# Patient Record
Sex: Male | Born: 1980 | Race: White | Hispanic: No | Marital: Married | Smoking: Never smoker
Health system: Southern US, Community
[De-identification: ages and names within clinical notes are randomized; demographics above are authoritative.]

## PROBLEM LIST (undated history)

## (undated) ENCOUNTER — Emergency Department: Discharge status: Absent without leave | Payer: Self-pay

## (undated) DIAGNOSIS — J45909 Unspecified asthma, uncomplicated: Secondary | ICD-10-CM

## (undated) DIAGNOSIS — I10 Essential (primary) hypertension: Secondary | ICD-10-CM

## (undated) DIAGNOSIS — R519 Headache, unspecified: Secondary | ICD-10-CM

## (undated) DIAGNOSIS — R51 Headache: Secondary | ICD-10-CM

## (undated) HISTORY — PX: APPENDECTOMY: SHX54

## (undated) HISTORY — DX: Essential (primary) hypertension: I10

## (undated) HISTORY — DX: Headache, unspecified: R51.9

## (undated) HISTORY — DX: Headache: R51

## (undated) HISTORY — DX: Unspecified asthma, uncomplicated: J45.909

---

## 2010-01-10 ENCOUNTER — Observation Stay: Payer: Self-pay | Admitting: Surgery

## 2015-03-24 ENCOUNTER — Encounter: Payer: Self-pay | Admitting: Physician Assistant

## 2015-03-24 ENCOUNTER — Ambulatory Visit: Payer: Self-pay | Admitting: Physician Assistant

## 2015-03-24 VITALS — BP 120/80 | HR 80 | Temp 98.3°F

## 2015-03-24 DIAGNOSIS — J209 Acute bronchitis, unspecified: Secondary | ICD-10-CM

## 2015-03-24 DIAGNOSIS — J4520 Mild intermittent asthma, uncomplicated: Secondary | ICD-10-CM

## 2015-03-24 MED ORDER — FLUTICASONE-SALMETEROL 100-50 MCG/DOSE IN AEPB: 1.0000 | INHALATION_SPRAY | Freq: Two times a day (BID) | RESPIRATORY_TRACT | Status: DC

## 2015-03-24 MED ORDER — METHYLPREDNISOLONE 4 MG PO TBPK: ORAL_TABLET | ORAL | Status: DC

## 2015-03-24 NOTE — Progress Notes (Signed)
S: C/o cough and congestion with wheezing and chest pain, chest is sore from coughing,  Denies fever, chills;  cough is dry and hacking; states was treated for bronchitis in December with augmentin and ventolin, got better, isn't coughing up any mucus now, but feels like the bronchial tubes are still irritated, hx of asthma, nonsmoker; use to use advair but is out of medication  Remainder ros neg  O: vitals wnl, nad, tms clear, throat injected, neck supple no lymph, lungs c t a, cv rrr, neuro intact  A:  Acute asthmatic bronchitis   P:  rx medication:  Medrol dose pack, advair , continue ventolin; use otc meds,, return if not better in 3 -5 days, return earlier if worsening

## 2015-10-01 ENCOUNTER — Other Ambulatory Visit: Payer: Self-pay | Admitting: Physician Assistant

## 2015-10-01 DIAGNOSIS — J4520 Mild intermittent asthma, uncomplicated: Secondary | ICD-10-CM

## 2015-10-01 DIAGNOSIS — J209 Acute bronchitis, unspecified: Secondary | ICD-10-CM

## 2016-03-30 ENCOUNTER — Ambulatory Visit: Payer: Self-pay | Admitting: Physician Assistant

## 2016-03-30 VITALS — BP 142/90 | HR 102 | Temp 99.7°F

## 2016-03-30 DIAGNOSIS — B349 Viral infection, unspecified: Secondary | ICD-10-CM

## 2016-03-30 DIAGNOSIS — R509 Fever, unspecified: Secondary | ICD-10-CM

## 2016-03-30 LAB — POCT INFLUENZA A/B
INFLUENZA B, POC: NEGATIVE
Influenza A, POC: NEGATIVE

## 2016-03-30 NOTE — Progress Notes (Signed)
S: C/o runny nose , sore throat, body aches, cough and congestion for 1 days, + fever, chills, temp around 100 last night; denies cp/sob, v/d;    Using otc meds: robitussin  O: PE: vitals wnl, nad,  perrl eomi, normocephalic, tms dull, nasal mucosa red and swollen, throat injected, neck supple no lymph, lungs c t a, cv rrr, neuro intact, flu swab neg  A:  Acute flu like illness   P: drink fluids, continue regular meds , use otc meds of choice, return if not improving in 5 days, return earlier if worsening  tamiflu 75mg  bid x 5d, tamiflu for his wife, call pediatrician about kids

## 2016-04-02 NOTE — Progress Notes (Signed)
Patient called and expressed that he has a bad cough.  I informed Darl PikesSusan and she authorized me to call in [employer]ls 200mg  w/ no refills in to [facility] [place], [place].

## 2016-07-26 ENCOUNTER — Encounter: Payer: Self-pay | Admitting: Physician Assistant

## 2016-07-26 ENCOUNTER — Ambulatory Visit: Payer: Self-pay | Admitting: Physician Assistant

## 2016-07-26 VITALS — BP 130/89 | HR 78 | Temp 98.3°F

## 2016-07-26 DIAGNOSIS — R590 Localized enlarged lymph nodes: Secondary | ICD-10-CM

## 2016-07-26 NOTE — Progress Notes (Signed)
   Subjective:cervical lymph node    Patient ID: Carlos NottinghamMatthew Long, male    DOB: September 28, 1980, 36 y.o.   MRN: [id number]  HPI Patient states left cervical lymph node for 2 days. States not visible but palpable. Denies fever/chill, ear or throat pain. Denies tobacco use.   Review of Systems    Negative except for compliant.  Objective:   Physical Exam HEENT unremarkable except for let anterior/lateral palpable nodular lesion.       Assessment & Plan:adenopathy  Advised to monitor lesion and follow up in two weeks. Return sooner if condition worsen.

## 2016-08-13 ENCOUNTER — Ambulatory Visit: Payer: Self-pay | Admitting: Physician Assistant

## 2016-08-13 ENCOUNTER — Encounter: Payer: Self-pay | Admitting: Physician Assistant

## 2016-08-13 VITALS — BP 149/100 | HR 70 | Temp 98.5°F | Resp 16

## 2016-08-13 DIAGNOSIS — R591 Generalized enlarged lymph nodes: Secondary | ICD-10-CM

## 2016-08-13 MED ORDER — AMOXICILLIN 875 MG PO TABS: 875.0000 mg | ORAL_TABLET | Freq: Two times a day (BID) | ORAL | 0 refills | Status: DC

## 2016-08-13 NOTE — Progress Notes (Signed)
S: c/o swollen nodes, states he can still feel them along his neck, not as swollen but still concerned, no fever/chills/fatigue/cp/sob  O: vitals w elevated bp, neck supple + lymph on left side of neck along tonsillar area and then again at lower end of cervical chain, nodes are mobile, lungs c t a, cv rrr  A: lymphadenopathy,   P: cbc, amoxil, if not better in 1 week will refer to ENT for evaluation

## 2016-08-14 LAB — CBC WITH DIFFERENTIAL/PLATELET
BASOS: 1 %
Basophils Absolute: 0 10*3/uL (ref 0.0–0.2)
EOS (ABSOLUTE): 0.3 10*3/uL (ref 0.0–0.4)
Eos: 4 %
Hematocrit: 44.8 % (ref 37.5–51.0)
Hemoglobin: 15.2 g/dL (ref 13.0–17.7)
Immature Grans (Abs): 0 10*3/uL (ref 0.0–0.1)
Immature Granulocytes: 0 %
LYMPHS: 21 %
Lymphocytes Absolute: 1.6 10*3/uL (ref 0.7–3.1)
MCH: 28.9 pg (ref 26.6–33.0)
MCHC: 33.9 g/dL (ref 31.5–35.7)
MCV: 85 fL (ref 79–97)
Monocytes Absolute: 0.5 10*3/uL (ref 0.1–0.9)
Monocytes: 6 %
Neutrophils Absolute: 5.3 10*3/uL (ref 1.4–7.0)
Neutrophils: 68 %
PLATELETS: 219 10*3/uL (ref 150–379)
RBC: 5.26 x10E6/uL (ref 4.14–5.80)
RDW: 13.7 % (ref 12.3–15.4)
WBC: 7.7 10*3/uL (ref 3.4–10.8)

## 2016-08-15 ENCOUNTER — Ambulatory Visit: Payer: Self-pay | Admitting: *Deleted

## 2016-08-15 VITALS — BP 160/104

## 2016-08-15 DIAGNOSIS — Z0130 Encounter for examination of blood pressure without abnormal findings: Secondary | ICD-10-CM

## 2016-08-15 DIAGNOSIS — R030 Elevated blood-pressure reading, without diagnosis of hypertension: Secondary | ICD-10-CM

## 2016-08-15 NOTE — Progress Notes (Signed)
Carlos Long presented to Carlos Long onsite RN requesting BP check as follow up from OV in clinic with Carlos Long on 6/25.  BP still elevated. Reports HA as only sx. Denies Hx HTN prior to one week ago.  Taking abx as Rx'd.   CBC results reviewed with pt. WNL. Copy provided to pt.  Requested RN inform clinic of BP. Will route encounter to PA.   Instructed to call clinic if sx not resolved in one week after abx for referral to ENT.  Pt verbalizes understanding and agreement. No further questions.

## 2016-09-05 ENCOUNTER — Telehealth: Payer: Self-pay | Admitting: *Deleted

## 2016-09-05 NOTE — Telephone Encounter (Signed)
Pt seen in on-site [place] clinic and BP rechecked.  08/29/16: 160/106 09/05/16: 158/106  Remains hypertensive. Pt reports improving diet, decreasing Na+ intake, increasing exercise. Trying to reduce stress levels. Reports strong family Hx of HTN in father and many other close family members on father's side. Questions if sleep apnea could contribute to HTN as his wife believes he does have OSA. Requests sleep apnea study referral thru [facility] as he does not have a PCP.   Darl PikesSusan, please let me know what your thoughts are and if/when you would like to see Molli HazardMatthew in the clinic again.

## 2016-09-05 NOTE — Telephone Encounter (Signed)
Due to the way their insurance is, Carlos Long usually wants the testing done through a pcp not the clinic.  He needs a pcp, refer to either [facility] [address], [facility], or [facility].  I can recheck his bp and evaluate meds but eventually he needs to have a pcp take care of this.  We just do acute care for TOG not primary care.

## 2016-09-05 NOTE — Telephone Encounter (Signed)
Thanks, if he cannot see a pcp for several months I will help him with the htn but not the sleep apnea

## 2016-09-05 NOTE — Telephone Encounter (Signed)
Contact info for referred pcp options given to pt via email with instruction to establish primary care for further HTN care.

## 2016-10-11 ENCOUNTER — Ambulatory Visit (INDEPENDENT_AMBULATORY_CARE_PROVIDER_SITE_OTHER): Payer: BLUE CROSS/BLUE SHIELD | Admitting: Family

## 2016-10-11 ENCOUNTER — Encounter: Payer: Self-pay | Admitting: Family

## 2016-10-11 ENCOUNTER — Ambulatory Visit: Payer: Self-pay | Admitting: Family Medicine

## 2016-10-11 VITALS — BP 158/104 | HR 63 | Temp 98.3°F | Ht 71.0 in | Wt 210.4 lb

## 2016-10-11 DIAGNOSIS — R0683 Snoring: Secondary | ICD-10-CM | POA: Diagnosis not present

## 2016-10-11 DIAGNOSIS — I10 Essential (primary) hypertension: Secondary | ICD-10-CM | POA: Diagnosis not present

## 2016-10-11 DIAGNOSIS — G4733 Obstructive sleep apnea (adult) (pediatric): Secondary | ICD-10-CM | POA: Insufficient documentation

## 2016-10-11 DIAGNOSIS — I159 Secondary hypertension, unspecified: Secondary | ICD-10-CM

## 2016-10-11 MED ORDER — AMLODIPINE BESYLATE 2.5 MG PO TABS: 2.5000 mg | ORAL_TABLET | Freq: Every day | ORAL | 3 refills | Status: DC

## 2016-10-11 NOTE — Assessment & Plan Note (Signed)
Pending sleep study

## 2016-10-11 NOTE — Patient Instructions (Signed)
start amlodipine as discussed today. I will start on a very low dose 2.5 mg. The goal of blood pressure is less than 130/80  If blood pressures not at goal you may increase  amlodipine to 5 mg total per day as long as tolerating the medication okay  Please follow-up in one month  Referral cardiology due to family history  Referral for sleep study  Fasting labs next week   Managing Your Hypertension Hypertension is commonly called high blood pressure. This is when the force of your blood pressing against the walls of your arteries is too strong. Arteries are blood vessels that carry blood from your heart throughout your body. Hypertension forces the heart to work harder to pump blood, and may cause the arteries to become narrow or stiff. Having untreated or uncontrolled hypertension can cause heart attack, stroke, kidney disease, and other problems. What are blood pressure readings? A blood pressure reading consists of a higher number over a lower number. Ideally, your blood pressure should be below 120/80. The first ("top") number is called the systolic pressure. It is a measure of the pressure in your arteries as your heart beats. The second ("bottom") number is called the diastolic pressure. It is a measure of the pressure in your arteries as the heart relaxes. What does my blood pressure reading mean? Blood pressure is classified into four stages. Based on your blood pressure reading, your health care provider may use the following stages to determine what type of treatment you need, if any. Systolic pressure and diastolic pressure are measured in a unit called mm Hg. Normal  Systolic pressure: below 120.  Diastolic pressure: below 80. Elevated  Systolic pressure: 120-129.  Diastolic pressure: below 80. Hypertension stage 1  Systolic pressure: 130-139.  Diastolic pressure: 80-89. Hypertension stage 2  Systolic pressure: 140 or above.  Diastolic pressure: 90 or above. What  health risks are associated with hypertension? Managing your hypertension is an important responsibility. Uncontrolled hypertension can lead to:  A heart attack.  A stroke.  A weakened blood vessel (aneurysm).  Heart failure.  Kidney damage.  Eye damage.  Metabolic syndrome.  Memory and concentration problems.  What changes can I make to manage my hypertension? Hypertension can be managed by making lifestyle changes and possibly by taking medicines. Your health care provider will help you make a plan to bring your blood pressure within a normal range. Eating and drinking  Eat a diet that is high in fiber and potassium, and low in salt (sodium), added sugar, and fat. An example eating plan is called the DASH (Dietary Approaches to Stop Hypertension) diet. To eat this way: ? Eat plenty of fresh fruits and vegetables. Try to fill half of your plate at each meal with fruits and vegetables. ? Eat whole grains, such as whole wheat pasta, brown rice, or whole grain bread. Fill about one quarter of your plate with whole grains. ? Eat low-fat diary products. ? Avoid fatty cuts of meat, processed or cured meats, and poultry with skin. Fill about one quarter of your plate with lean proteins such as fish, chicken without skin, beans, eggs, and tofu. ? Avoid premade and processed foods. These tend to be higher in sodium, added sugar, and fat.  Reduce your daily sodium intake. Most people with hypertension should eat less than 1,500 mg of sodium a day.  Limit alcohol intake to no more than 1 drink a day for nonpregnant women and 2 drinks a day for men. One  drink equals 12 oz of beer, 5 oz of wine, or 1 oz of hard liquor. Lifestyle  Work with your health care provider to maintain a healthy body weight, or to lose weight. Ask what an ideal weight is for you.  Get at least 30 minutes of exercise that causes your heart to beat faster (aerobic exercise) most days of the week. Activities may  include walking, swimming, or biking.  Include exercise to strengthen your muscles (resistance exercise), such as weight lifting, as part of your weekly exercise routine. Try to do these types of exercises for 30 minutes at least 3 days a week.  Do not use any products that contain nicotine or tobacco, such as cigarettes and e-cigarettes. If you need help quitting, ask your health care provider.  Control any long-term (chronic) conditions you have, such as high cholesterol or diabetes. Monitoring  Monitor your blood pressure at home as told by your health care provider. Your personal target blood pressure may vary depending on your medical conditions, your age, and other factors.  Have your blood pressure checked regularly, as often as told by your health care provider. Working with your health care provider  Review all the medicines you take with your health care provider because there may be side effects or interactions.  Talk with your health care provider about your diet, exercise habits, and other lifestyle factors that may be contributing to hypertension.  Visit your health care provider regularly. Your health care provider can help you create and adjust your plan for managing hypertension. Will I need medicine to control my blood pressure? Your health care provider may prescribe medicine if lifestyle changes are not enough to get your blood pressure under control, and if:  Your systolic blood pressure is 130 or higher.  Your diastolic blood pressure is 80 or higher.  Take medicines only as told by your health care provider. Follow the directions carefully. Blood pressure medicines must be taken as prescribed. The medicine does not work as well when you skip doses. Skipping doses also puts you at risk for problems. Contact a health care provider if:  You think you are having a reaction to medicines you have taken.  You have repeated (recurrent) headaches.  You feel  dizzy.  You have swelling in your ankles.  You have trouble with your vision. Get help right away if:  You develop a severe headache or confusion.  You have unusual weakness or numbness, or you feel faint.  You have severe pain in your chest or abdomen.  You vomit repeatedly.  You have trouble breathing. Summary  Hypertension is when the force of blood pumping through your arteries is too strong. If this condition is not controlled, it may put you at risk for serious complications.  Your personal target blood pressure may vary depending on your medical conditions, your age, and other factors. For most people, a normal blood pressure is less than 120/80.  Hypertension is managed by lifestyle changes, medicines, or both. Lifestyle changes include weight loss, eating a healthy, low-sodium diet, exercising more, and limiting alcohol. This information is not intended to replace advice given to you by your health care provider. Make sure you discuss any questions you have with your health care provider. Document Released: 10/31/2011 Document Revised: 01/04/2016 Document Reviewed: 01/04/2016 Elsevier Interactive Patient Education  [employer].

## 2016-10-11 NOTE — Addendum Note (Signed)
Addended by: Elise Benne T on: 10/11/2016 10:31 AM   Modules accepted: Orders

## 2016-10-11 NOTE — Progress Notes (Signed)
Pre visit review using our clinic review tool, if applicable. No additional management support is needed unless otherwise documented below in the visit note. 

## 2016-10-11 NOTE — Progress Notes (Signed)
Subjective:    Patient ID: Carlos Long, male    DOB: 1981/01/20, 36 y.o.   MRN: [id number]  CC: Carlos Long is a 36 y.o. male who presents today to establish care.    HPI: Concern for elevated blood pressures. He brings a log from home which shows numbers from 160/106-178/100  Denies exertional chest pain or pressure, numbness or tingling radiating to left arm or jaw, palpitations, dizziness, frequent headaches, changes in vision, or shortness of breath.   Also concerned for snoring. Wife tells him he snores and stops breathing during his sleep  Strong family h/o CVD. Had stress test 13+ years ago, and thinks normal.      HISTORY:  Past Medical History:  Diagnosis Date  . Asthma   . Frequent headaches   . Hypertension    Past Surgical History:  Procedure Laterality Date  . APPENDECTOMY     Family History  Problem Relation Age of Onset  . Diabetes Mother   . Arthritis Father   . Cancer Father        prostate  . Hyperlipidemia Father   . Hypertension Father   . Stroke Father 45  . Heart disease Father        CABG x 3  . Alcohol abuse Paternal Uncle   . Cancer Paternal Uncle        prostate  . Heart disease Paternal Grandmother   . Hypertension Paternal Grandmother   . Stroke Paternal Grandmother   . Pancreatic cancer Paternal Grandmother   . Alcohol abuse Paternal Grandfather   . Heart disease Paternal Grandfather   . Hypertension Paternal Grandfather   . Stroke Paternal Grandfather     Allergies: Patient has no known allergies. Current Outpatient Prescriptions on File Prior to Visit  Medication Sig Dispense Refill  . ADVAIR DISKUS 100-50 MCG/DOSE AEPB INHALE 1 PUFF INTO THE LUNGS 2 (TWO) TIMES DAILY. 60 each 3  . albuterol (PROVENTIL) (5 MG/ML) 0.5% nebulizer solution Take 2.5 mg by nebulization every 6 (six) hours as needed for wheezing or shortness of breath.     No current facility-administered medications on file prior to visit.     Social  History  Substance Use Topics  . Smoking status: Never Smoker  . Smokeless tobacco: Never Used  . Alcohol use 0.0 oz/week    Review of Systems  Constitutional: Negative for chills and fever.  Eyes: Negative for visual disturbance.  Respiratory: Negative for cough.   Cardiovascular: Negative for chest pain and palpitations.  Gastrointestinal: Negative for nausea and vomiting.  Neurological: Negative for headaches.      Objective:    BP (!) 158/104   Pulse 63   Temp 98.3 F (36.8 C) (Oral)   Ht 5\' 11"  (1.803 m)   Wt 210 lb 6.4 oz (95.4 kg)   SpO2 97%   BMI 29.34 kg/m  BP Readings from Last 3 Encounters:  10/11/16 (!) 158/104  08/15/16 (!) 160/104  08/13/16 (!) 149/100   Wt Readings from Last 3 Encounters:  10/11/16 210 lb 6.4 oz (95.4 kg)    Physical Exam  Constitutional: He appears well-developed and well-nourished.  Cardiovascular: Regular rhythm and normal heart sounds.   Pulmonary/Chest: Effort normal and breath sounds normal. No respiratory distress. He has no wheezes. He has no rhonchi. He has no rales.  Neurological: He is alert.  Skin: Skin is warm and dry.  Psychiatric: He has a normal mood and affect. His speech is normal and behavior is normal.  Vitals reviewed.      Assessment & Plan:   Problem List Items Addressed This Visit      Cardiovascular and Mediastinum   Secondary hypertension - Primary    Elevated today. We'll start amlodipine . Pending labs including lipid panel. We jointly agreed based on strong family history cardiovascular disease, CVA that we will refer patient for cardiac workup including possible stress test. Will follow with me in one month       Relevant Medications   amLODipine (NORVASC) 2.5 MG tablet   Other Relevant Orders   Ambulatory referral to Cardiology   Comprehensive metabolic panel   Lipid panel   Hemoglobin A1c     Other   Snores    Pending sleep study.      Relevant Orders   Ambulatory referral to Sleep  Studies       I am having Mr. Fatima start on amLODipine. I am also having him maintain his albuterol and ADVAIR DISKUS.   Meds ordered this encounter  Medications  . amLODipine (NORVASC) 2.5 MG tablet    Sig: Take 1 tablet (2.5 mg total) by mouth daily.    Dispense:  90 tablet    Refill:  3    Order Specific Question:   Supervising Provider    Answer:   Sherlene Shams [[id number]]    Return precautions given.   Risks, benefits, and alternatives of the medications and treatment plan prescribed today were discussed, and patient expressed understanding.   Education regarding symptom management and diagnosis given to patient on AVS.  Continue to follow with Allegra Grana, FNP for routine health maintenance.   [name] and I agreed with plan.   Rennie Plowman, FNP

## 2016-10-11 NOTE — Assessment & Plan Note (Signed)
Elevated today. We'll start amlodipine . Pending labs including lipid panel. We jointly agreed based on strong family history cardiovascular disease, CVA that we will refer patient for cardiac workup including possible stress test. Will follow with me in one month

## 2016-10-19 ENCOUNTER — Other Ambulatory Visit (INDEPENDENT_AMBULATORY_CARE_PROVIDER_SITE_OTHER): Payer: BLUE CROSS/BLUE SHIELD

## 2016-10-19 ENCOUNTER — Telehealth: Payer: Self-pay | Admitting: *Deleted

## 2016-10-19 DIAGNOSIS — I159 Secondary hypertension, unspecified: Secondary | ICD-10-CM

## 2016-10-19 DIAGNOSIS — I10 Essential (primary) hypertension: Secondary | ICD-10-CM

## 2016-10-19 LAB — LIPID PANEL
CHOL/HDL RATIO: 5
CHOLESTEROL: 205 mg/dL — AB (ref 0–200)
HDL: 42.4 mg/dL (ref >39.00)
LDL Cholesterol: 143 mg/dL — ABNORMAL HIGH (ref 0–99)
NonHDL: 162.7
TRIGLYCERIDES: 97 mg/dL (ref 0.0–149.0)
VLDL: 19.4 mg/dL (ref 0.0–40.0)

## 2016-10-19 LAB — COMPREHENSIVE METABOLIC PANEL
ALBUMIN: 4.8 g/dL (ref 3.5–5.2)
ALT: 38 U/L (ref 0–53)
AST: 24 U/L (ref 0–37)
Alkaline Phosphatase: 68 U/L (ref 39–117)
BILIRUBIN TOTAL: 0.5 mg/dL (ref 0.2–1.2)
BUN: 17 mg/dL (ref 6–23)
CALCIUM: 10.2 mg/dL (ref 8.4–10.5)
CO2: 29 mEq/L (ref 19–32)
CREATININE: 0.99 mg/dL (ref 0.40–1.50)
Chloride: 102 mEq/L (ref 96–112)
GFR: 89.64 mL/min (ref >60.00)
Glucose, Bld: 85 mg/dL (ref 70–99)
Potassium: 4.2 mEq/L (ref 3.5–5.1)
Sodium: 137 mEq/L (ref 135–145)
Total Protein: 7.6 g/dL (ref 6.0–8.3)

## 2016-10-19 LAB — HEMOGLOBIN A1C: Hgb A1c MFr Bld: 5.4 % (ref 4.6–6.5)

## 2016-10-19 NOTE — Telephone Encounter (Signed)
Opened in Error.

## 2016-12-11 ENCOUNTER — Ambulatory Visit: Payer: Self-pay | Admitting: Cardiovascular Disease

## 2016-12-16 NOTE — Progress Notes (Signed)
Cardiology Office Note  Date:  12/17/2016   ID:  Carlos Long, DOB [birth year], MRN [id number]  PCP:  Allegra GranaArnett, Carlos G, FNP   Chief Complaint  Patient presents with  . other    Ref by Carlos PlowmanMargaret Arnett, FNP for secondary HTN. "doing well." Pt. has a strong family history of CAD.     HPI:  Carlos Long is a 36 year old gentleman with past medical history of HTN ?OSA/snoring Fm hx of CAD Who presents by referral from Carlos Long for consultation of his risk factors for coronary disease, strong family history  He reports that he is a non-smoker, no diabetes Does exercise  active, 2 children, Denies any symptoms concerning for angina on exertion No shortness of breath,  No PND orthopnea Dad with CABG, 6150s, CVA nonsmoker,  Patient started on amlodipine 2.5 mg daily Blood pressure was fine and he stopped checking his blood pressure Mildly elevated on today's visit  EKG personally reviewed by myself on todays visit Shows normal sinus rhythm with rate 60 bpm no significant ST or T wave changes  In terms of his family history Father with bypass in his 2750s, he was a non-smoker Mother dm II,   Dads brother, druugs and ETOH Dads other brother, smoker,  Dads mother pancreastic CA Grandfther: MI, CVA   PMH:   has a past medical history of Asthma; Frequent headaches; and Hypertension.  PSH:    Past Surgical History:  Procedure Laterality Date  . APPENDECTOMY      Current Outpatient Prescriptions  Medication Sig Dispense Refill  . ADVAIR DISKUS 100-50 MCG/DOSE AEPB INHALE 1 PUFF INTO THE LUNGS 2 (TWO) TIMES DAILY. 60 each 3  . albuterol (PROVENTIL) (5 MG/ML) 0.5% nebulizer solution Take 2.5 mg by nebulization every 6 (six) hours as needed for wheezing or shortness of breath.    Marland Kitchen. amLODipine (NORVASC) 2.5 MG tablet Take 1 tablet (2.5 mg total) by mouth daily. 90 tablet 3   No current facility-administered medications for this visit.      Allergies:   Patient has no  known allergies.   Social History:  The patient  reports that he has never smoked. He has never used smokeless tobacco. He reports that he drinks alcohol. He reports that he does not use drugs.   Family History:   family history includes Alcohol abuse in his paternal grandfather and paternal uncle; Arthritis in his father; Cancer in his father and paternal uncle; Diabetes in his mother; Heart disease in his father, paternal grandfather, and paternal grandmother; Hyperlipidemia in his father; Hypertension in his father, paternal grandfather, and paternal grandmother; Pancreatic cancer in his paternal grandmother; Stroke in his paternal grandfather and paternal grandmother; Stroke (age of onset: 2448) in his father.    Review of Systems: Review of Systems  Constitutional: Negative.   Respiratory: Negative.   Cardiovascular: Negative.   Gastrointestinal: Negative.   Musculoskeletal: Negative.   Neurological: Negative.   Psychiatric/Behavioral: Negative.   All other systems reviewed and are negative.    PHYSICAL EXAM: VS:  BP (!) 140/96 (BP Location: Right Arm, Patient Position: Sitting, Cuff Size: Normal)   Pulse 60   Ht 5\' 11"  (1.803 m)   Wt 197 lb 8 oz (89.6 kg)   BMI 27.55 kg/m  , BMI Body mass index is 27.55 kg/m. GEN: Well nourished, well developed, in no acute distress  HEENT: normal  Neck: no JVD, carotid bruits, or masses Cardiac: RRR; no murmurs, rubs, or gallops,no edema  Respiratory:  clear  to auscultation bilaterally, normal work of breathing GI: soft, nontender, nondistended, + BS MS: no deformity or atrophy  Skin: warm and dry, no rash Neuro:  Strength and sensation are intact Psych: euthymic mood, full affect    Recent Labs: 08/13/2016: Hemoglobin 15.2; Platelets 219 10/19/2016: ALT 38; BUN 17; Creatinine, Ser 0.99; Potassium 4.2; Sodium 137    Lipid Panel Lab Results  Component Value Date   CHOL 205 (H) 10/19/2016   HDL 42.40 10/19/2016   LDLCALC 143 (H)  10/19/2016   TRIG 97.0 10/19/2016      Wt Readings from Last 3 Encounters:  12/17/16 197 lb 8 oz (89.6 kg)  10/11/16 210 lb 6.4 oz (95.4 kg)       ASSESSMENT AND PLAN:  Essential hypertension - Plan: EKG 12-Lead Blood pressure mildly elevated on today's visit Possibly white coat syndrome Recommend he restart checking his blood pressure at home and call our office if diastolic pressure runs more than 90 Amlodipine could be increased up to 5 or 10 mg daily if needed  Family history of coronary artery disease Discussed his family history at length Father with severe coronary disease requiring bypass surgery, no risk factors per the patient We have discussed various risk stratification techniques such as CT coronary calcium scoring or carotid ultrasound Young age, with no symptoms, stress testing would likely be of little clinical benefit and would not pick up moderate disease  He did not order any testing at this time, will think about it and call our office if he would like any screening studies  Mixed hyperlipidemia LDL 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 EKG 12-Lead     Signed, Carlos Long, M.D., Ph.D. 12/17/2016  [facility] [place], [place] [phone]

## 2016-12-17 ENCOUNTER — Ambulatory Visit (INDEPENDENT_AMBULATORY_CARE_PROVIDER_SITE_OTHER): Payer: 59 | Admitting: Cardiovascular Disease

## 2016-12-17 ENCOUNTER — Encounter: Payer: Self-pay | Admitting: Cardiovascular Disease

## 2016-12-17 VITALS — BP 140/96 | HR 60 | Ht 71.0 in | Wt 197.5 lb

## 2016-12-17 DIAGNOSIS — Z8249 Family history of ischemic heart disease and other diseases of the circulatory system: Secondary | ICD-10-CM

## 2016-12-17 DIAGNOSIS — E782 Mixed hyperlipidemia: Secondary | ICD-10-CM | POA: Diagnosis not present

## 2016-12-17 DIAGNOSIS — I10 Essential (primary) hypertension: Secondary | ICD-10-CM | POA: Diagnosis not present

## 2016-12-17 NOTE — Patient Instructions (Addendum)
Medication Instructions:   No medication changes made  Monitor blood pressure Goal is 130s/<90  Call if you need amlodipine 5 mg or 10 daily  Labwork:  No new labs needed  Testing/Procedures:  No further testing at this time  Research CT coronary calcium score, [place], $150  Other screening test is the carotid ultrasound $50   Follow-Up: It was a pleasure seeing you in the office today. Please call us if you have new issues that need to be addressed before your next appt.  [phone]  Your physician wants you to follow-up in:  As needed  If you need a refill on your cardiac medications before your next appointment, please call your pharmacy.

## 2017-01-26 ENCOUNTER — Other Ambulatory Visit: Payer: Self-pay | Admitting: Family

## 2017-01-26 DIAGNOSIS — I159 Secondary hypertension, unspecified: Secondary | ICD-10-CM

## 2017-05-27 ENCOUNTER — Other Ambulatory Visit: Payer: Self-pay | Admitting: Family

## 2017-05-27 DIAGNOSIS — I159 Secondary hypertension, unspecified: Secondary | ICD-10-CM

## 2017-06-27 ENCOUNTER — Ambulatory Visit: Payer: Self-pay | Admitting: *Deleted

## 2017-06-27 NOTE — Telephone Encounter (Signed)
Patient is calling to report he is concerned about more frequent "fluttering" he is having in his chest.  No Chest fluttering protocol- chest pain questions used. Reason for Disposition . [1] Chest pain lasts > 5 minutes AND [2] occurred > 3 days ago (72 hours) AND [3] NO chest pain or cardiac symptoms now  Answer Assessment - Initial Assessment Questions 1. LOCATION: "Where does it hurt?"       Mostly on left side of chest- on breast 2. RADIATION: "Does the pain go anywhere else?" (e.g., into neck, jaw, arms, back)     Just in chest- some times on R as well- mostly on L 3. ONSET: "When did the chest pain begin?" (Minutes, hours or days)      Fluttering has been present past few months- more frequent and stronger in the past few month- 3 1/2 - does not take breath away 4. PATTERN "Does the pain come and go, or has it been constant since it started?"  "Does it get worse with exertion?"      Comes and goes, exertion does not seem to matter 5. DURATION: "How long does it last" (e.g., seconds, minutes, hours)     3-4 seconds- feels "discomfort" 6. SEVERITY: "How bad is the pain?"  (e.g., Scale 1-10; mild, moderate, or severe)    - MILD (1-3): doesn't interfere with normal activities     - MODERATE (4-7): interferes with normal activities or awakens from sleep    - SEVERE (8-10): excruciating pain, unable to do any normal activities       Fluttering is getting attention 7. CARDIAC RISK FACTORS: "Do you have any history of heart problems or risk factors for heart disease?" (e.g., prior heart attack, angina; high blood pressure, diabetes, being overweight, high cholesterol, smoking, or strong family history of heart disease)     Blood pressure medication, slightly elevated 8. PULMONARY RISK FACTORS: "Do you have any history of lung disease?"  (e.g., blood clots in lung, asthma, emphysema, birth control pills)     No- allergy induced asthma 9. CAUSE: "What do you think is causing the chest pain?"     Stress 10. OTHER SYMPTOMS: "Do you have any other symptoms?" (e.g., dizziness, nausea, vomiting, sweating, fever, difficulty breathing, cough)       Frequent headaches 11. PREGNANCY: "Is there any chance you are pregnant?" "When was your last menstrual period?"       n/a  Protocols used: CHEST PAIN-A-AH

## 2017-06-28 ENCOUNTER — Telehealth: Payer: Self-pay | Admitting: Family

## 2017-06-28 ENCOUNTER — Ambulatory Visit: Payer: Self-pay | Admitting: Family

## 2017-06-28 NOTE — Telephone Encounter (Signed)
Thank you Olegario Messier. As you said, if palpations recur , he need to be seen at [facility] or ED.I agree with your plan ,advice.

## 2017-06-28 NOTE — Telephone Encounter (Signed)
FYI patient is scheduled to see Claris Che today at 4:00
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
## 2017-06-28 NOTE — Telephone Encounter (Signed)
Spoke with patient advised him PCP was out of office due to Emergency, patient stated he understood. 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 Denies chest pain , just flutters ( no flutters in last 12 hours), at times while working enough to get his attention. Advised patient also to call and consult with cardiologist as to concerns, patient last saw cardiology 10/18.  Patient could not take sooner appointment 07/03/17) due to work schedule. Patient was advised to seek medical attention immediately if symptoms worsen reoccur, or has chest pain. Patient agreed.

## 2017-06-28 NOTE — Telephone Encounter (Signed)
PCP is having to leave office need to reschedule patient, needs triage of symptoms prior to scheduling due to symptoms wore or the same patient may need to call cardiology for sooner appointment or be seen at ER or UC.   [facility] triage nurse may triage but would need to discuss scheduling.

## 2017-06-30 NOTE — Telephone Encounter (Signed)
Noted. Advised to go UC and agree

## 2017-07-03 ENCOUNTER — Ambulatory Visit: Payer: Self-pay | Admitting: Family

## 2017-10-18 ENCOUNTER — Ambulatory Visit (INDEPENDENT_AMBULATORY_CARE_PROVIDER_SITE_OTHER): Payer: Managed Care, Other (non HMO) | Admitting: Family

## 2017-10-18 ENCOUNTER — Encounter: Payer: Self-pay | Admitting: Family

## 2017-10-18 ENCOUNTER — Other Ambulatory Visit: Payer: Self-pay | Admitting: Family

## 2017-10-18 VITALS — BP 130/110 | HR 67 | Temp 98.5°F | Ht 71.0 in | Wt 202.2 lb

## 2017-10-18 DIAGNOSIS — Z23 Encounter for immunization: Secondary | ICD-10-CM | POA: Diagnosis not present

## 2017-10-18 DIAGNOSIS — K219 Gastro-esophageal reflux disease without esophagitis: Secondary | ICD-10-CM | POA: Insufficient documentation

## 2017-10-18 DIAGNOSIS — J4520 Mild intermittent asthma, uncomplicated: Secondary | ICD-10-CM

## 2017-10-18 DIAGNOSIS — Z0000 Encounter for general adult medical examination without abnormal findings: Secondary | ICD-10-CM | POA: Diagnosis not present

## 2017-10-18 DIAGNOSIS — I159 Secondary hypertension, unspecified: Secondary | ICD-10-CM | POA: Diagnosis not present

## 2017-10-18 DIAGNOSIS — R5383 Other fatigue: Secondary | ICD-10-CM | POA: Diagnosis not present

## 2017-10-18 DIAGNOSIS — R0683 Snoring: Secondary | ICD-10-CM

## 2017-10-18 MED ORDER — AMLODIPINE BESYLATE 5 MG PO TABS: 5.0000 mg | ORAL_TABLET | Freq: Every day | ORAL | 3 refills | Status: DC

## 2017-10-18 MED ORDER — ALBUTEROL SULFATE (5 MG/ML) 0.5% IN NEBU: 2.5000 mg | INHALATION_SOLUTION | Freq: Four times a day (QID) | RESPIRATORY_TRACT | 2 refills | Status: DC | PRN

## 2017-10-18 MED ORDER — FLUTICASONE-SALMETEROL 100-50 MCG/DOSE IN AEPB: 1.0000 | INHALATION_SPRAY | Freq: Two times a day (BID) | RESPIRATORY_TRACT | 3 refills | Status: DC

## 2017-10-18 NOTE — Assessment & Plan Note (Addendum)
Stable.  Refilled inhalers as requested

## 2017-10-18 NOTE — Assessment & Plan Note (Signed)
Pending sleep study

## 2017-10-18 NOTE — Progress Notes (Signed)
Subjective:    Patient ID: Carlos Long, male    DOB: 1980-07-29, 37 y.o.   MRN: [id number]  CC: [name] is a 37 y.o. male who presents today for physical exam.    HPI: Trouble sleeping for 6 months. Fatigued. Would also like Testerone tested. On rotating schedule at work. Trouble falling and staying asleep. Wakes up 4x per night. Snores. Endorses frequent ha and not really restored for long periods of time.  Works as Emergency planning/management officer.   HTN at home 104/80 few months ago. hasnt checked recently. No longer having palpitations. No CP, SOB.   GERD- prilosec everyday with symptom controlled.  Never smoker. No hoarseness, trouble swallowing.   Asthma- no wheezing, sob. Would like refills of inhaler as pecan trees in [place] will trigger      Seen at [facility] clinic  For palpitations- started on amlodipine, metoprolol G[place]an 11/2017- reviewed note. No stress testing at his age  Colorectal  Cancer Screening: No early family history Prostate Cancer Screening: Father had prostate cancer. Has been discussed with patient; patient consented to screen early due to elevated risk.No trouble urinating, hesitancy, testicular swelling, pain. No concern for STDs.   Immunizations       Tetanus -due         HIV Screening- Candidate for , declines Labs: Screening labs today. Exercise: No regular exercise.  Alcohol use: Rare Smoking/tobacco use: Nonsmoker.  Regular dental exams: UTD Wears seat belt: Yes. Skin: no skin changes; declines referral to dermatology  HISTORY:  Past Medical History:  Diagnosis Date  . Asthma   . Frequent headaches   . Hypertension     Past Surgical History:  Procedure Laterality Date  . APPENDECTOMY     Family History  Problem Relation Age of Onset  . Diabetes Mother   . Arthritis Father   . Cancer Father        prostate  . Hyperlipidemia Father   . Hypertension Father   . Stroke Father 49  . Heart disease Father        CABG x 3  . Alcohol  abuse Paternal Uncle   . Cancer Paternal Uncle        prostate  . Heart disease Paternal Grandmother   . Hypertension Paternal Grandmother   . Stroke Paternal Grandmother   . Pancreatic cancer Paternal Grandmother   . Alcohol abuse Paternal Grandfather   . Heart disease Paternal Grandfather   . Hypertension Paternal Grandfather   . Stroke Paternal Grandfather   . Colon cancer Neg Hx       ALLERGIES: Patient has no known allergies.  Current Outpatient Medications on File Prior to Visit  Medication Sig Dispense Refill  . metoprolol succinate (TOPROL-XL) 50 MG 24 hr tablet     . omeprazole (PRILOSEC) 20 MG capsule Take by mouth.     No current facility-administered medications on file prior to visit.     Social History   Tobacco Use  . Smoking status: Never Smoker  . Smokeless tobacco: Never Used  Substance Use Topics  . Alcohol use: Yes    Alcohol/week: 0.0 standard drinks  . Drug use: No    Review of Systems  Constitutional: Positive for fever. Negative for chills.  HENT: Negative for congestion.   Respiratory: Negative for cough and shortness of breath.   Cardiovascular: Negative for chest pain, palpitations and leg swelling.  Gastrointestinal: Negative for abdominal pain, diarrhea, nausea and vomiting.  Genitourinary: Negative for difficulty urinating, scrotal swelling  and testicular pain.  Musculoskeletal: Negative for myalgias.  Skin: Negative for rash.  Neurological: Positive for headaches.  Hematological: Negative for adenopathy.  Psychiatric/Behavioral: Positive for sleep disturbance. Negative for confusion.      Objective:    BP (!) 130/110   Pulse 67   Temp 98.5 F (36.9 C) (Oral)   Ht 5\' 11"  (1.803 m)   Wt 202 lb 4 oz (91.7 kg)   SpO2 97%   BMI 28.21 kg/m   BP Readings from Last 3 Encounters:  10/18/17 (!) 130/110  12/17/16 (!) 140/96  10/11/16 (!) 158/104   Wt Readings from Last 3 Encounters:  10/18/17 202 lb 4 oz (91.7 kg)  12/17/16 197  lb 8 oz (89.6 kg)  10/11/16 210 lb 6.4 oz (95.4 kg)    Physical Exam  Constitutional: He appears well-developed and well-nourished.  Neck: No thyroid mass and no thyromegaly present.  Cardiovascular: Regular rhythm and normal heart sounds.  Pulmonary/Chest: Effort normal and breath sounds normal. No respiratory distress. He has no wheezes. He has no rhonchi. He has no rales.  Lymphadenopathy:       Head (right side): No submental, no submandibular, no tonsillar, no preauricular, no posterior auricular and no occipital adenopathy present.       Head (left side): No submental, no submandibular, no tonsillar, no preauricular, no posterior auricular and no occipital adenopathy present.    He has no cervical adenopathy.    He has no axillary adenopathy.  Neurological: He is alert.  Skin: Skin is warm and dry.  Psychiatric: He has a normal mood and affect. His speech is normal and behavior is normal.  Vitals reviewed.      Assessment & Plan:   Problem List Items Addressed This Visit      Cardiovascular and Mediastinum   HTN (hypertension)    SBP Elevated. Increase amlodipine. Patient will let me know if not better      Relevant Medications   metoprolol succinate (TOPROL-XL) 50 MG 24 hr tablet   amLODipine (NORVASC) 5 MG tablet     Respiratory   Mild intermittent asthma without complication    Stable.  Refilled inhalers as requested       Relevant Medications   Fluticasone-Salmeterol (ADVAIR DISKUS) 100-50 MCG/DOSE AEPB   albuterol (PROVENTIL) (5 MG/ML) 0.5% nebulizer solution     Digestive   GERD (gastroesophageal reflux disease)    Symptoms controlled on PPI.  No red flag or alarm symptoms.  Patient declines consult with GI for possible EGD at this time which I think is reasonable.  I advised him that at some point, he would need EGD which he understood.will follow      Relevant Medications   omeprazole (PRILOSEC) 20 MG capsule     Other   Snores    Pending sleep  study.      Routine physical examination - Primary    Tdap given today.  Patient politely declines testicular, prostate exam.  However we did order his PSA based on his father's history of prostate cancer.      Relevant Orders   CBC with Differential/Platelet   Comprehensive metabolic panel   Hemoglobin A1c   Lipid panel   TSH   PSA   VITAMIN D 25 Hydroxy (Vit-D Deficiency, Fractures)   Testosterone , Free and Total   Other fatigue    Suspect multifactorial discussed with patient today.  Lack of exercise, rotating shift schedule, possible sleep apnea.  Pending labs include anemia, testosterone,  thyroid.      Relevant Orders   Ambulatory referral to Sleep Studies   CBC with Differential/Platelet   TSH   VITAMIN D 25 Hydroxy (Vit-D Deficiency, Fractures)   Testosterone , Free and Total    Other Visit Diagnoses    Need for diphtheria-tetanus-pertussis (Tdap) vaccine       Relevant Orders   Tdap vaccine greater than or equal to 7yo IM (Completed)       I have discontinued Zayin Schecter's amLODipine. I have changed his ADVAIR DISKUS to Fluticasone-Salmeterol. I have also changed his albuterol. Additionally, I am having him start on amLODipine. Lastly, I am having him maintain his metoprolol succinate and omeprazole.   Meds ordered this encounter  Medications  . Fluticasone-Salmeterol (ADVAIR DISKUS) 100-50 MCG/DOSE AEPB    Sig: Inhale 1 puff into the lungs 2 (two) times daily.    Dispense:  60 each    Refill:  3    Order Specific Question:   Supervising Provider    Answer:   Duncan Dull L [[id number]]  . albuterol (PROVENTIL) (5 MG/ML) 0.5% nebulizer solution    Sig: Take 0.5 mLs (2.5 mg total) by nebulization every 6 (six) hours as needed for wheezing or shortness of breath.    Dispense:  20 mL    Refill:  2    Order Specific Question:   Supervising Provider    Answer:   Darrick Huntsman, TERESA L [[id number]]  . amLODipine (NORVASC) 5 MG tablet    Sig: Take 1 tablet (5 mg total) by  mouth daily.    Dispense:  90 tablet    Refill:  3    Order Specific Question:   Supervising Provider    Answer:   Sherlene Shams [[id number]]    Return precautions given.   Risks, benefits, and alternatives of the medications and treatment plan prescribed today were discussed, and patient expressed understanding.   Education regarding symptom management and diagnosis given to patient on AVS.   Continue to follow with Allegra Grana, FNP for routine health maintenance.   [name] and I agreed with plan.   Rennie Plowman, FNP

## 2017-10-18 NOTE — Assessment & Plan Note (Signed)
Suspect multifactorial discussed with patient today.  Lack of exercise, rotating shift schedule, possible sleep apnea.  Pending labs include anemia, testosterone, thyroid.

## 2017-10-18 NOTE — Assessment & Plan Note (Addendum)
Symptoms controlled on PPI.  No red flag or alarm symptoms.  Patient declines consult with GI for possible EGD at this time which I think is reasonable.  I advised him that at some point, he would need EGD which he understood.will follow

## 2017-10-18 NOTE — Patient Instructions (Addendum)
Increase amlodipine to 5mg   Monitor blood pressure,  Goal is less than 130/80; if persistently higher, please make sooner follow up appointment so we can recheck you blood pressure and manage medications  Today we discussed referrals, orders. Sleep study   I have placed these orders in the system for you.  Please be sure to give [place] a call if you have not heard from our office regarding scheduling a test or regarding referral in a timely manner.  It is very important that you let me know as soon as possible.     Essentials for good sleep:   #1 Exercise #2 Limit Caffeine ( no caffeine after lunch) #3 No smart phones, TV prior to bed -- BLUE light is VERY activating and send the brain an 'awake message.'  #4 May try over the counter Unisom for sleep aid    Health Maintenance, Male A healthy lifestyle and preventive care is important for your health and wellness. Ask your health care provider about what schedule of regular examinations is right for you. What should I know about weight and diet? Eat a Healthy Diet  Eat plenty of vegetables, fruits, whole grains, low-fat dairy products, and lean protein.  Do not eat a lot of foods high in solid fats, added sugars, or salt.  Maintain a Healthy Weight Regular exercise can help you achieve or maintain a healthy weight. You should:  Do at least 150 minutes of exercise each week. The exercise should increase your heart rate and make you sweat (moderate-intensity exercise).  Do strength-training exercises at least twice a week.  Watch Your Levels of Cholesterol and Blood Lipids  Have your blood tested for lipids and cholesterol every 5 years starting at 39 years of age. If you are at high risk for heart disease, you should start having your blood tested when you are 39 years old. You may need to have your cholesterol levels checked more often if: ? Your lipid or cholesterol levels are high. ? You are older than 39 years of age. ? You are  at high risk for heart disease.  What should I know about cancer screening? Many types of cancers can be detected early and may often be prevented. Lung Cancer  You should be screened every year for lung cancer if: ? You are a current smoker who has smoked for at least 30 years. ? You are a former smoker who has quit within the past 15 years.  Talk to your health care provider about your screening options, when you should start screening, and how often you should be screened.  Colorectal Cancer  Routine colorectal cancer screening usually begins at 39 years of age and should be repeated every 5-10 years until you are 39 years old. You may need to be screened more often if early forms of precancerous polyps or small growths are found. Your health care provider may recommend screening at an earlier age if you have risk factors for colon cancer.  Your health care provider may recommend using home test kits to check for hidden blood in the stool.  A small camera at the end of a tube can be used to examine your colon (sigmoidoscopy or colonoscopy). This checks for the earliest forms of colorectal cancer.  Prostate and Testicular Cancer  Depending on your age and overall health, your health care provider may do certain tests to screen for prostate and testicular cancer.  Talk to your health care provider about any symptoms or concerns  you have about testicular or prostate cancer.  Skin Cancer  Check your skin from head to toe regularly.  Tell your health care provider about any new moles or changes in moles, especially if: ? There is a change in a mole's size, shape, or color. ? You have a mole that is larger than a pencil eraser.  Always use sunscreen. Apply sunscreen liberally and repeat throughout the day.  Protect yourself by wearing long sleeves, pants, a wide-brimmed hat, and sunglasses when outside.  What should I know about heart disease, diabetes, and high blood  pressure?  If you are 5818-39 years of age, have your blood pressure checked every 3-5 years. If you are 39 years of age or older, have your blood pressure checked every year. You should have your blood pressure measured twice-once when you are at a hospital or clinic, and once when you are not at a hospital or clinic. Record the average of the two measurements. To check your blood pressure when you are not at a hospital or clinic, you can use: ? An automated blood pressure machine at a pharmacy. ? A home blood pressure monitor.  Talk to your health care provider about your target blood pressure.  If you are between 2045-39 years old, ask your health care provider if you should take aspirin to prevent heart disease.  Have regular diabetes screenings by checking your fasting blood sugar level. ? If you are at a normal weight and have a low risk for diabetes, have this test once every three years after the age of 39. ? If you are overweight and have a high risk for diabetes, consider being tested at a younger age or more often.  A one-time screening for abdominal aortic aneurysm (AAA) by ultrasound is recommended for men aged 65-75 years who are current or former smokers. What should I know about preventing infection? Hepatitis B If you have a higher risk for hepatitis B, you should be screened for this virus. Talk with your health care provider to find out if you are at risk for hepatitis B infection. Hepatitis C Blood testing is recommended for:  Everyone born from [birth year] through 1967.  Anyone with known risk factors for hepatitis C.  Sexually Transmitted Diseases (STDs)  You should be screened each year for STDs including gonorrhea and chlamydia if: ? You are sexually active and are younger than 39 years of age. ? You are older than 39 years of age and your health care provider tells you that you are at risk for this type of infection. ? Your sexual activity has changed since you were last  screened and you are at an increased risk for chlamydia or gonorrhea. Ask your health care provider if you are at risk.  Talk with your health care provider about whether you are at high risk of being infected with HIV. Your health care provider may recommend a prescription medicine to help prevent HIV infection.  What else can I do?  Schedule regular health, dental, and eye exams.  Stay current with your vaccines (immunizations).  Do not use any tobacco products, such as cigarettes, chewing tobacco, and e-cigarettes. If you need help quitting, ask your health care provider.  Limit alcohol intake to no more than 2 drinks per day. One drink equals 12 ounces of beer, 5 ounces of wine, or 1 ounces of hard liquor.  Do not use street drugs.  Do not share needles.  Ask your health care provider for help  if you need support or information about quitting drugs.  Tell your health care provider if you often feel depressed.  Tell your health care provider if you have ever been abused or do not feel safe at home. This information is not intended to replace advice given to you by your health care provider. Make sure you discuss any questions you have with your health care provider. Document Released: 08/04/2007 Document Revised: 10/05/2015 Document Reviewed: 11/09/2014 Elsevier Interactive Patient Education  [employer].

## 2017-10-18 NOTE — Assessment & Plan Note (Addendum)
SBP Elevated. Increase amlodipine. Patient will let me know if not better

## 2017-10-18 NOTE — Assessment & Plan Note (Signed)
Tdap given today.  Patient politely declines testicular, prostate exam.  However we did order his PSA based on his father's history of prostate cancer.

## 2017-10-22 LAB — CBC WITH DIFFERENTIAL/PLATELET
BASOS ABS: 79 {cells}/uL (ref 0–200)
Basophils Relative: 0.6 %
EOS ABS: 144 {cells}/uL (ref 15–500)
Eosinophils Relative: 1.1 %
HEMATOCRIT: 42.3 % (ref 38.5–50.0)
HEMOGLOBIN: 14.6 g/dL (ref 13.2–17.1)
Lymphs Abs: 1651 cells/uL (ref 850–3900)
MCH: 29.1 pg (ref 27.0–33.0)
MCHC: 34.5 g/dL (ref 32.0–36.0)
MCV: 84.3 fL (ref 80.0–100.0)
MONOS PCT: 6.2 %
MPV: 11.1 fL (ref 7.5–12.5)
NEUTROS ABS: 10415 {cells}/uL — AB (ref 1500–7800)
Neutrophils Relative %: 79.5 %
Platelets: 222 10*3/uL (ref 140–400)
RBC: 5.02 10*6/uL (ref 4.20–5.80)
RDW: 12.4 % (ref 11.0–15.0)
Total Lymphocyte: 12.6 %
WBC mixed population: 812 cells/uL (ref 200–950)
WBC: 13.1 10*3/uL — ABNORMAL HIGH (ref 3.8–10.8)

## 2017-10-22 LAB — LIPID PANEL
Cholesterol: 200 mg/dL — ABNORMAL HIGH (ref <200)
HDL: 44 mg/dL (ref >40)
LDL Cholesterol (Calc): 132 mg/dL (calc) — ABNORMAL HIGH
Non-HDL Cholesterol (Calc): 156 mg/dL (calc) — ABNORMAL HIGH (ref <130)
TRIGLYCERIDES: 128 mg/dL (ref <150)
Total CHOL/HDL Ratio: 4.5 (calc) (ref <5.0)

## 2017-10-22 LAB — HEMOGLOBIN A1C
EAG (MMOL/L): 6 (calc)
Hgb A1c MFr Bld: 5.4 % of total Hgb (ref <5.7)
MEAN PLASMA GLUCOSE: 108 (calc)

## 2017-10-22 LAB — PSA: PSA: 1.2 ng/mL (ref <=4.0)

## 2017-10-22 LAB — VITAMIN D 25 HYDROXY (VIT D DEFICIENCY, FRACTURES): Vit D, 25-Hydroxy: 32 ng/mL (ref 30–100)

## 2017-10-22 LAB — COMPREHENSIVE METABOLIC PANEL
AG RATIO: 1.8 (calc) (ref 1.0–2.5)
ALT: 36 U/L (ref 9–46)
AST: 22 U/L (ref 10–40)
Albumin: 4.7 g/dL (ref 3.6–5.1)
Alkaline phosphatase (APISO): 66 U/L (ref 40–115)
BUN: 15 mg/dL (ref 7–25)
CHLORIDE: 105 mmol/L (ref 98–110)
CO2: 25 mmol/L (ref 20–32)
Calcium: 9.7 mg/dL (ref 8.6–10.3)
Creat: 1.18 mg/dL (ref 0.60–1.35)
GLOBULIN: 2.6 g/dL (ref 1.9–3.7)
Glucose, Bld: 85 mg/dL (ref 65–99)
Potassium: 4.6 mmol/L (ref 3.5–5.3)
SODIUM: 140 mmol/L (ref 135–146)
TOTAL PROTEIN: 7.3 g/dL (ref 6.1–8.1)
Total Bilirubin: 0.5 mg/dL (ref 0.2–1.2)

## 2017-10-22 LAB — TESTOSTERONE, FREE & TOTAL

## 2017-10-22 LAB — TSH: TSH: 0.3 m[IU]/L — AB (ref 0.40–4.50)

## 2017-10-23 NOTE — Telephone Encounter (Signed)
Spoke with pharmacist and he stated disregard refill request

## 2017-10-30 ENCOUNTER — Other Ambulatory Visit: Payer: Self-pay | Admitting: Family

## 2017-10-30 DIAGNOSIS — R7989 Other specified abnormal findings of blood chemistry: Secondary | ICD-10-CM

## 2017-10-30 NOTE — Progress Notes (Signed)
close

## 2017-11-01 ENCOUNTER — Telehealth: Payer: Self-pay | Admitting: *Deleted

## 2017-11-01 NOTE — Telephone Encounter (Signed)
Copied from CRM [id number]. Topic: General - Other >> Nov 01, 2017  3:10 PM Marylen PontoMcneil, Ja-Kwan wrote: Reason for CRM: Pt request lab results from 10/18/17 visit. Cb# [phone]

## 2017-11-01 NOTE — Telephone Encounter (Signed)
They were resulted .Marland Kitchen. Can you see my result note?

## 2017-11-01 NOTE — Telephone Encounter (Signed)
Patient given results and scheduled.

## 2017-11-04 ENCOUNTER — Other Ambulatory Visit (INDEPENDENT_AMBULATORY_CARE_PROVIDER_SITE_OTHER): Payer: Managed Care, Other (non HMO)

## 2017-11-04 DIAGNOSIS — R7989 Other specified abnormal findings of blood chemistry: Secondary | ICD-10-CM | POA: Diagnosis not present

## 2017-11-04 LAB — CBC WITH DIFFERENTIAL/PLATELET
BASOS ABS: 0 10*3/uL (ref 0.0–0.1)
Basophils Relative: 0.5 % (ref 0.0–3.0)
EOS ABS: 0.1 10*3/uL (ref 0.0–0.7)
Eosinophils Relative: 1.2 % (ref 0.0–5.0)
HEMATOCRIT: 43.7 % (ref 39.0–52.0)
HEMOGLOBIN: 14.8 g/dL (ref 13.0–17.0)
LYMPHS PCT: 15 % (ref 12.0–46.0)
Lymphs Abs: 1.5 10*3/uL (ref 0.7–4.0)
MCHC: 33.9 g/dL (ref 30.0–36.0)
MCV: 86.2 fl (ref 78.0–100.0)
MONO ABS: 0.5 10*3/uL (ref 0.1–1.0)
Monocytes Relative: 5.5 % (ref 3.0–12.0)
Neutro Abs: 7.6 10*3/uL (ref 1.4–7.7)
Neutrophils Relative %: 77.8 % — ABNORMAL HIGH (ref 43.0–77.0)
PLATELETS: 232 10*3/uL (ref 150.0–400.0)
RBC: 5.07 Mil/uL (ref 4.22–5.81)
RDW: 12.8 % (ref 11.5–15.5)
WBC: 9.7 10*3/uL (ref 4.0–10.5)

## 2017-11-04 LAB — TSH: TSH: 0.23 u[IU]/mL — AB (ref 0.35–4.50)

## 2017-11-04 LAB — T4, FREE: FREE T4: 1 ng/dL (ref 0.60–1.60)

## 2017-11-04 LAB — T3, FREE: T3 FREE: 3.7 pg/mL (ref 2.3–4.2)

## 2017-11-05 ENCOUNTER — Encounter: Payer: Self-pay | Admitting: Family

## 2017-11-09 LAB — TESTOSTERONE, FREE & TOTAL
Free Testosterone: 74.9 pg/mL (ref 35.0–155.0)
TESTOSTERONE, TOTAL, LC-MS-MS: 384 ng/dL (ref 250–1100)

## 2017-11-11 ENCOUNTER — Other Ambulatory Visit: Payer: Self-pay | Admitting: Family

## 2017-11-11 DIAGNOSIS — I159 Secondary hypertension, unspecified: Secondary | ICD-10-CM

## 2017-11-11 DIAGNOSIS — E0590 Thyrotoxicosis, unspecified without thyrotoxic crisis or storm: Secondary | ICD-10-CM

## 2017-12-16 ENCOUNTER — Telehealth: Payer: Self-pay | Admitting: Family

## 2017-12-16 NOTE — Telephone Encounter (Signed)
Copied from CRM [id number]. Topic: Quick Communication - See Telephone Encounter >> Dec 16, 2017  4:38 PM Angela Nevin wrote: CRM for notification. See Telephone encounter for: 12/16/17.  Pt called inquiring if the results from his in-home sleep study have been reviewed stating that he received a cpap machine though the mail today. Pt would like a call back to review when they arrive. Please advise.

## 2017-12-23 ENCOUNTER — Encounter: Payer: Self-pay | Admitting: Family

## 2017-12-23 NOTE — Telephone Encounter (Signed)
Sent mychart

## 2017-12-26 NOTE — Telephone Encounter (Signed)
Left voicemail to call ok for [facility] to speak with patient . We still haven't received sleep study. Can you please have them fax back to office at [phone]. Thanks

## 2018-01-03 NOTE — Telephone Encounter (Signed)
Did you receive sleep study

## 2018-01-10 ENCOUNTER — Encounter: Payer: Self-pay | Admitting: Family

## 2018-01-10 NOTE — Telephone Encounter (Signed)
Spoke to patient he denies any vision changes ,problems. If he doesn't hear back from eye MD , he will go to urgent care.

## 2018-01-10 NOTE — Telephone Encounter (Signed)
I spoke with patient again he denies possible foreign body getting into eye, through work or noticing. He is still waiting to hear from eye doctor.

## 2018-01-10 NOTE — Telephone Encounter (Signed)
Called [facility] and they're faxing sleep study to our office

## 2018-01-14 NOTE — Telephone Encounter (Signed)
Did you get sleep study

## 2018-06-06 ENCOUNTER — Emergency Department: Payer: Managed Care, Other (non HMO)

## 2018-06-06 ENCOUNTER — Emergency Department
Admission: EM | Admit: 2018-06-06 | Discharge: 2018-06-06 | Disposition: A | Discharge status: Home / Self Care | Payer: Managed Care, Other (non HMO) | Attending: Emergency Medicine | Admitting: Emergency Medicine

## 2018-06-06 ENCOUNTER — Other Ambulatory Visit: Payer: Self-pay

## 2018-06-06 ENCOUNTER — Ambulatory Visit: Payer: Self-pay | Admitting: Family

## 2018-06-06 DIAGNOSIS — I10 Essential (primary) hypertension: Secondary | ICD-10-CM | POA: Diagnosis not present

## 2018-06-06 DIAGNOSIS — R079 Chest pain, unspecified: Secondary | ICD-10-CM

## 2018-06-06 DIAGNOSIS — R0789 Other chest pain: Secondary | ICD-10-CM | POA: Insufficient documentation

## 2018-06-06 DIAGNOSIS — J45909 Unspecified asthma, uncomplicated: Secondary | ICD-10-CM | POA: Diagnosis not present

## 2018-06-06 DIAGNOSIS — Z79899 Other long term (current) drug therapy: Secondary | ICD-10-CM | POA: Insufficient documentation

## 2018-06-06 DIAGNOSIS — R0602 Shortness of breath: Secondary | ICD-10-CM | POA: Diagnosis present

## 2018-06-06 LAB — COMPREHENSIVE METABOLIC PANEL
ALT: 43 U/L (ref 0–44)
AST: 31 U/L (ref 15–41)
Albumin: 4.6 g/dL (ref 3.5–5.0)
Alkaline Phosphatase: 81 U/L (ref 38–126)
Anion gap: 9 (ref 5–15)
BUN: 17 mg/dL (ref 6–20)
CO2: 26 mmol/L (ref 22–32)
Calcium: 9.2 mg/dL (ref 8.9–10.3)
Chloride: 102 mmol/L (ref 98–111)
Creatinine, Ser: 0.88 mg/dL (ref 0.61–1.24)
GFR calc Af Amer: >60 mL/min (ref >60)
GFR calc non Af Amer: >60 mL/min (ref >60)
Glucose, Bld: 125 mg/dL — ABNORMAL HIGH (ref 70–99)
Potassium: 3.8 mmol/L (ref 3.5–5.1)
Sodium: 137 mmol/L (ref 135–145)
Total Bilirubin: 0.4 mg/dL (ref 0.3–1.2)
Total Protein: 7.4 g/dL (ref 6.5–8.1)

## 2018-06-06 LAB — CBC WITH DIFFERENTIAL/PLATELET
Abs Immature Granulocytes: 0.05 10*3/uL (ref 0.00–0.07)
Basophils Absolute: 0.1 10*3/uL (ref 0.0–0.1)
Basophils Relative: 1 %
Eosinophils Absolute: 0.1 10*3/uL (ref 0.0–0.5)
Eosinophils Relative: 1 %
HCT: 42.4 % (ref 39.0–52.0)
Hemoglobin: 14.5 g/dL (ref 13.0–17.0)
Immature Granulocytes: 0 %
Lymphocytes Relative: 6 %
Lymphs Abs: 0.9 10*3/uL (ref 0.7–4.0)
MCH: 29.1 pg (ref 26.0–34.0)
MCHC: 34.2 g/dL (ref 30.0–36.0)
MCV: 85 fL (ref 80.0–100.0)
Monocytes Absolute: 0.9 10*3/uL (ref 0.1–1.0)
Monocytes Relative: 6 %
Neutro Abs: 13.4 10*3/uL — ABNORMAL HIGH (ref 1.7–7.7)
Neutrophils Relative %: 86 %
Platelets: 228 10*3/uL (ref 150–400)
RBC: 4.99 MIL/uL (ref 4.22–5.81)
RDW: 12.2 % (ref 11.5–15.5)
WBC: 15.4 10*3/uL — ABNORMAL HIGH (ref 4.0–10.5)
nRBC: 0 % (ref 0.0–0.2)

## 2018-06-06 LAB — FIBRIN DERIVATIVES D-DIMER (ARMC ONLY): Fibrin derivatives D-dimer (ARMC): 307.44 ng/mL (FEU) (ref 0.00–499.00)

## 2018-06-06 LAB — TROPONIN I
Troponin I: <0.03 ng/mL (ref <0.03)
Troponin I: <0.03 ng/mL (ref <0.03)

## 2018-06-06 LAB — BRAIN NATRIURETIC PEPTIDE: B Natriuretic Peptide: 15 pg/mL (ref 0.0–100.0)

## 2018-06-06 MED ORDER — KETOROLAC TROMETHAMINE 30 MG/ML IJ SOLN
30.0000 mg | Freq: Once | INTRAMUSCULAR | Status: AC
  Administered 2018-06-06: 30 mg via INTRAVENOUS
  Filled 2018-06-06: qty 1

## 2018-06-06 NOTE — Telephone Encounter (Addendum)
I wanted to get tested for COVID-19 is reason caller is calling in. 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   No one that I know of that is positive for the COVID-19 virus. Has been experiencing chest pain and shortness of breath for the last 2-3 hours without any other symptoms.  Unable to check temperature at home.    I have referred him to the ED.   His wife is going to take him now to [facility].  I have sent these notes to Rennie Plowman, FNP so she would be aware.  I called the ED at [facility] and spoke with [place].  I let her know what was going on with the pt, his name and that he was coming in by private vehicle.  Reason for Disposition . [1] MODERATE difficulty breathing (e.g., speaks in phrases, SOB even at rest, pulse 100-120) AND [2] NEW-onset or WORSE than normal  Answer Assessment - Initial Assessment Questions 1. RESPIRATORY STATUS: "Describe your breathing?" (e.g., wheezing, shortness of breath, unable to speak, severe coughing)      I'm am having chest pain when I take deep breaths.   I'm shivering.   No exposure to COVID-19 that I know of. 2. ONSET: "When did this breathing problem begin?"      2-3 hours ago.   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    3. PATTERN "Does the difficult breathing come and go, or has it been constant since it started?"      Constant. 4. SEVERITY: "How bad is your breathing?" (e.g., mild, moderate, severe)    - MILD: No SOB at rest, mild SOB with walking, speaks normally in sentences, can lay down, no retractions, pulse < 100.    - MODERATE: SOB at rest, SOB with minimal exertion and prefers to sit, cannot lie down flat, speaks in phrases, mild retractions, audible wheezing, pulse 100-120.    - SEVERE: Very SOB at rest, speaks in single words, struggling to breathe, sitting hunched forward, retractions, pulse > 120      If I lay down it's worse.   If I sit up it helps.   I'm  short of breath. 5. RECURRENT SYMPTOM: "Have you had difficulty breathing before?" If so, ask: "When was the last time?" and "What happened that time?"      No 6. CARDIAC HISTORY: "Do you have any history of heart disease?" (e.g., heart attack, angina, bypass surgery, angioplasty)      Hypertension controlled with medication 7. LUNG HISTORY: "Do you have any history of lung disease?"  (e.g., pulmonary embolus, asthma, emphysema)     Asthma from allergies in the winter time 8. CAUSE: "What do you think is causing the breathing problem?"      I don't know. 9. OTHER SYMPTOMS: "Do you have any other symptoms? (e.g., dizziness, runny nose, cough, chest pain, fever)     Chest pain is worse when I take a deep breath. 10. PREGNANCY: "Is there any chance you are pregnant?" "When was your last menstrual period?"       N/A 11. TRAVEL: "Have you traveled out of the country in the last month?" (e.g., travel history, exposures)       No travels.  Protocols used: BREATHING DIFFICULTY-A-AH

## 2018-06-06 NOTE — Telephone Encounter (Signed)
Patient is scheduled for ED follow-up for Monday 06/09/18.

## 2018-06-06 NOTE — ED Provider Notes (Signed)
[facility] Emergency Department Provider Note   ____________________________________________   First MD Initiated Contact with Patient 06/06/18 [phone]     (approximate)  I have reviewed the triage vital signs and the nursing notes.   HISTORY  Chief Complaint Shortness of Breath   HPI Carlos Long is a 38 y.o. 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  He was sitting in his quad room and developed sudden onset of chest pain tightness in the middle of his chest.  It is made worse by deep breathing.  As he was going home he developed increasing shortness of breath.  He has not had any coughing or fever.  He is never had anything like this before.         Past Medical History:  Diagnosis Date  . Asthma   . Frequent headaches   . Hypertension     Patient Active Problem List   Diagnosis Date Noted  . Routine physical examination 10/18/2017  . Other fatigue 10/18/2017  . Mild intermittent asthma without complication 10/18/2017  . GERD (gastroesophageal reflux disease) 10/18/2017  . Family history of coronary artery disease 12/17/2016  . Mixed hyperlipidemia 12/17/2016  . HTN (hypertension) 10/11/2016  . OSA (obstructive sleep apnea) 10/11/2016    Past Surgical History:  Procedure Laterality Date  . APPENDECTOMY      Prior to Admission medications   Medication Sig Start Date End Date Taking? Authorizing Provider  albuterol (PROVENTIL) (5 MG/ML) 0.5% nebulizer solution Take 0.5 mLs (2.5 mg total) by nebulization every 6 (six) hours as needed for wheezing or shortness of breath. 10/18/17   Allegra Grana, FNP  amLODipine (NORVASC) 5 MG tablet Take 1 tablet (5 mg total) by mouth daily. 10/18/17   Allegra Grana, FNP  Fluticasone-Salmeterol (ADVAIR DISKUS) 100-50 MCG/DOSE AEPB Inhale 1 puff into the lungs 2 (two) times daily. 10/18/17   Allegra Grana, FNP  metoprolol succinate (TOPROL-XL) 50 MG 24 hr tablet  10/11/17   [provider]  omeprazole (PRILOSEC) 20 MG capsule Take by mouth. 06/28/17 06/28/18  [provider]    Allergies Patient has no known allergies.  Family History  Problem Relation Age of Onset  . Diabetes Mother   . Arthritis Father   . Cancer Father        prostate  . Hyperlipidemia Father   . Hypertension Father   . Stroke Father 53  . Heart disease Father        CABG x 3  . Alcohol abuse Paternal Uncle   . Cancer Paternal Uncle        prostate  . Heart disease Paternal Grandmother   . Hypertension Paternal Grandmother   . Stroke Paternal Grandmother   . Pancreatic cancer Paternal Grandmother   . Alcohol abuse Paternal Grandfather   . Heart disease Paternal Grandfather   . Hypertension Paternal Grandfather   . Stroke Paternal Grandfather   . Colon cancer Neg Hx     Social History Social History   Tobacco Use  . Smoking status: Never Smoker  . Smokeless tobacco: Never Used  Substance Use Topics  . Alcohol use: Yes    Alcohol/week: 0.0 standard drinks  . Drug use: No    Review of Systems  Constitutional: No fever/chills Eyes: No visual changes. ENT: No sore throat. Cardiovascular: See HPI . Respiratory: See HPI Gastrointestinal: No abdominal pain.  No nausea, no vomiting.  No diarrhea.  No constipation. Genitourinary: Negative for dysuria. Musculoskeletal:  Negative for back pain. Skin: Negative for rash. Neurological: Negative for headaches, focal weakness  ____________________________________________   PHYSICAL EXAM:  VITAL SIGNS: ED Triage Vitals  Enc Vitals Group     BP 06/06/18 0834 (!) 145/97     Pulse Rate 06/06/18 0834 98     Resp 06/06/18 0834 (!) 24     Temp 06/06/18 0834 98.6 F (37 C)     Temp Source 06/06/18 0834 Oral     SpO2 06/06/18 0834 98 %     Weight 06/06/18 0836 197 lb (89.4 kg)     Height 06/06/18 0836 5\' 11"  (1.803 m)     Head Circumference --      Peak Flow --      Pain Score 06/06/18 0836 4     Pain Loc --      Pain Edu?  --      Excl. in GC? --     Constitutional: Alert and oriented. Well appearing and in no acute distress. Eyes: Conjunctivae are normal.  Head: Atraumatic. Nose: No congestion/rhinnorhea. Mouth/Throat: Mucous membranes are moist.  Oropharynx non-erythematous. Neck: No stridor.  Cardiovascular: Normal rate, regular rhythm. Grossly normal heart sounds.  Good peripheral circulation. Respiratory: Normal respiratory effort.  No retractions. Lungs CTAB. Gastrointestinal: Soft and nontender. No distention. No abdominal bruits. No CVA tenderness. Musculoskeletal: No lower extremity tenderness nor edema. Neurologic:  Normal speech and language. No gross focal neurologic deficits are appreciated.  Skin:  Skin is warm, dry and intact. No rash noted. Psychiatric: Mood and affect are normal. Speech and behavior are normal.  ____________________________________________   LABS (all labs ordered are listed, but only abnormal results are displayed)  Labs Reviewed  COMPREHENSIVE METABOLIC PANEL - Abnormal; Notable for the following components:      Result Value   Glucose, Bld 125 (*)    All other components within normal limits  CBC WITH DIFFERENTIAL/PLATELET - Abnormal; Notable for the following components:   WBC 15.4 (*)    Neutro Abs 13.4 (*)    All other components within normal limits  TROPONIN I  FIBRIN DERIVATIVES D-DIMER ([facility] ONLY)  BRAIN NATRIURETIC PEPTIDE  TROPONIN I   ____________________________________________  EKG  EKG read interpreted by me shows normal sinus rhythm rate of 86 normal axis no acute ST-T wave changes ____________________________________________  RADIOLOGY  ED MD interpretation:    Official radiology report(s): Dg Chest Port 1 View  Result Date: 06/06/2018 CLINICAL DATA:  Chest pain and shortness of breath EXAM: PORTABLE CHEST 1 VIEW COMPARISON:  None. FINDINGS: Lungs are clear. Heart size and pulmonary vascularity are normal. No adenopathy. No  pneumothorax. No bone lesions. IMPRESSION: No edema or consolidation. Electronically Signed   By: Bretta Bang III M.D.   On: 06/06/2018 09:16    ____________________________________________   PROCEDURES  Procedure(s) performed (including Critical Care):  Procedures   ____________________________________________   INITIAL IMPRESSION / ASSESSMENT AND PLAN / ED COURSE Patient's labs are good except for the white blood count which is up slightly.  Chest x-ray is clear he is not running a fever he is not coughing he has no symptoms of coronavirus.  Chest pain and came on suddenly but his EKG is good as are both of his troponins.  He has seen Dr. Mariah Milling in the past.  He does have a family history of heart disease.  I will have him follow-up with Dr. Mariah Milling the cardiologist call him on Monday.  We will have him return here if he  is worse at all.  I should add that his chest pain got better with a shot of Toradol.  I will let him continue to take some Motrin.  Have him return here if he gets worse at all.         Clinical Course as of Jun 05 1208  Fri Jun 06, 2018  0929 Chloride: 102 [PM]    Clinical Course User Index [PM] Arnaldo NatalMalinda, Anaya Bovee F, MD     ____________________________________________   FINAL CLINICAL IMPRESSION(S) / ED DIAGNOSES  Final diagnoses:  Atypical chest pain     ED Discharge Orders    None       Note:  This document was prepared using Dragon voice recognition software and may include unintentional dictation errors.    Arnaldo NatalMalinda, Elberta Lachapelle F, MD 06/06/18 [phone]

## 2018-06-06 NOTE — ED Notes (Signed)
Pt alert and oriented X4, active, cooperative, pt in NAD. RR even and unlabored, color WNL.  Pt informed to return if any life threatening symptoms occur.  Discharge and followup instructions reviewed. Ambulates safely. Discussed cardiology referral.

## 2018-06-06 NOTE — Telephone Encounter (Signed)
LMTCB asking him to make ED f/u ASAP.

## 2018-06-06 NOTE — ED Triage Notes (Signed)
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

## 2018-06-06 NOTE — Discharge Instructions (Addendum)
Please take Motrin 3 of the over-the-counter pills 3 times a day for 3 days.  This should help with the chest pain.  Please return here for increasing shortness of breath, coughing, fever or worse chest pain.  Please follow-up with Dr. Mariah Milling, the cardiologist.  He will double check on your chest pain.  Call his office Monday morning for follow-up he should be able to see you Monday or Tuesday.

## 2018-06-06 NOTE — Telephone Encounter (Signed)
He is in ED  Sarah, call him later and offer f/u doxy , presuming his is not admitted

## 2018-06-09 ENCOUNTER — Ambulatory Visit (INDEPENDENT_AMBULATORY_CARE_PROVIDER_SITE_OTHER): Payer: Managed Care, Other (non HMO) | Admitting: Family

## 2018-06-09 ENCOUNTER — Telehealth: Payer: Self-pay | Admitting: Cardiovascular Disease

## 2018-06-09 ENCOUNTER — Encounter: Payer: Self-pay | Admitting: Family

## 2018-06-09 DIAGNOSIS — R079 Chest pain, unspecified: Secondary | ICD-10-CM | POA: Diagnosis not present

## 2018-06-09 DIAGNOSIS — I159 Secondary hypertension, unspecified: Secondary | ICD-10-CM | POA: Diagnosis not present

## 2018-06-09 MED ORDER — AMLODIPINE BESYLATE 10 MG PO TABS: 10.0000 mg | ORAL_TABLET | Freq: Every day | ORAL | 1 refills | Status: DC

## 2018-06-09 NOTE — Assessment & Plan Note (Addendum)
Reviewed emergency room visit with patient today. Etiology remains nonspecific. ? Asthma, GERD, pleurisy.    Chest pain, sob is improving.  He denies any acute symptoms today.  I placed a referral to cardiology today and patient will let me know of any new or worsening symptoms. He will let me know if he hasnt heard from [place] in the next 1-2 days for appointment.

## 2018-06-09 NOTE — Progress Notes (Signed)
This visit type was conducted due to national recommendations for restrictions regarding the COVID-19 pandemic (e.g. social distancing).  This format is felt to be most appropriate for this patient at this time.  All issues noted in this document were discussed and addressed.  No physical exam was performed (except for noted visual exam findings with Video Visits). Virtual Visit via Video Note  I connected with@  on 06/09/18 at 10:30 AM EDT by a video enabled telemedicine application and verified that I am speaking with the correct person using two identifiers.  Location patient: home Location provider:work  Persons participating in the virtual visit: patient, provider  I discussed the limitations of evaluation and management by telemedicine and the availability of in person appointments. The patient expressed understanding and agreed to proceed.  Interactive audio and video telecommunications were attempted between this provider and patient, however failed, due to patient having technical difficulties or patient did not have access to video capability.  We continued and completed visit with audio only.    HPI:  Chest pain x 4 days, improving.  Had to sleep sitting up over past 2 days, last night able to lay supine. SOB has improved. On advair. Hasnt tried nebulizer.   Sitting at computer at work, and felt chest tightness which started 4 days ago. Had taken prilosec day prior at 6pm, approx 12 hours prior. Eaten 2-3 hours prior to episode. Took vest off which didn't help. Suddenly had weight on chest and couldn't breath. Had pain in over 'sternum' in chest and couldn't lay down or sit down to get comfortable. Pain was not worsened when pressed on pushed on chest.  He went to urgent care at that time, then ED.   Pain improved with toradol in ED.  No LE edema, cough, fever, epigastric burning.   Gollan - last seen 2018. No stress test.   HTN- compliant with medication. At home 125/90.    Asthma- compliant with medication.   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 Normal BNP.  Given toradol. Low risk D dimer Follow up with Dr Mariah MillingGollan.    ROS: See pertinent positives and negatives per HPI.  Past Medical History:  Diagnosis Date  . Asthma   . Frequent headaches   . Hypertension     Past Surgical History:  Procedure Laterality Date  . APPENDECTOMY      Family History  Problem Relation Age of Onset  . Diabetes Mother   . Arthritis Father   . Cancer Father        prostate  . Hyperlipidemia Father   . Hypertension Father   . Stroke Father [id number]  . Heart disease Father        CABG x 3  . Alcohol abuse Paternal Uncle   . Cancer Paternal Uncle        prostate  . Heart disease Paternal Grandmother   . Hypertension Paternal Grandmother   . Stroke Paternal Grandmother   . Pancreatic cancer Paternal Grandmother   . Alcohol abuse Paternal Grandfather   . Heart disease Paternal Grandfather   . Hypertension Paternal Grandfather   . Stroke Paternal Grandfather   . Colon cancer Neg Hx     SOCIAL HX:  Non smoker   Current Outpatient Medications:  .  amLODipine (NORVASC) 10 MG tablet, Take 1 tablet (10 mg total) by mouth  daily., Disp: 90 tablet, Rfl: 1 .  Fluticasone-Salmeterol (ADVAIR DISKUS) 100-50 MCG/DOSE AEPB, Inhale 1 puff into the lungs 2 (two) times daily., Disp: 60 each, Rfl: 3 .  omeprazole (PRILOSEC) 20 MG capsule, Take by mouth., Disp: , Rfl:  .  albuterol (PROVENTIL) (5 MG/ML) 0.5% nebulizer solution, Take 0.5 mLs (2.5 mg total) by nebulization every 6 (six) hours as needed for wheezing or shortness of breath. (Patient not taking: Reported on 06/09/2018), Disp: 20 mL, Rfl: 2   ASSESSMENT AND PLAN:  Discussed the following assessment and plan:  Secondary hypertension - Plan: Ambulatory referral to Cardiology, amLODipine  (NORVASC) 10 MG tablet  Chest pain, unspecified type  Problem List Items Addressed This Visit      Cardiovascular and Mediastinum   HTN (hypertension) - Primary    Elevated.  Advised patient to go ahead and increase his amlodipine.  He will send me blood pressure readings from  home      Relevant Medications   amLODipine (NORVASC) 10 MG tablet   Other Relevant Orders   Ambulatory referral to Cardiology     Other   Chest pain    Reviewed emergency room visit with patient today. Etiology remains nonspecific. ? Asthma, GERD, pleurisy.    Chest pain, sob is improving.  He denies any acute symptoms today.  I placed a referral to cardiology today and patient will let me know of any new or worsening symptoms. He will let me know if he hasnt heard from [place] in the next 1-2 days for appointment.            I discussed the assessment and treatment plan with the patient. The patient was provided an opportunity to ask questions and all were answered. The patient agreed with the plan and demonstrated an understanding of the instructions.   The patient was advised to call back or seek an in-person evaluation if the symptoms worsen or if the condition fails to improve as anticipated.   Rennie Plowman, FNP

## 2018-06-09 NOTE — Progress Notes (Signed)
Virtual Visit via Video Note   This visit type was conducted due to national recommendations for restrictions regarding the COVID-19 Pandemic (e.g. social distancing) in an effort to limit this patient's exposure and mitigate transmission in our community.  Due to his co-morbid illnesses, this patient is at least at moderate risk for complications without adequate follow up.  This format is felt to be most appropriate for this patient at this time.  All issues noted in this document were discussed and addressed.  A limited physical exam was performed with this format.  Please refer to the patient's chart for his consent to telehealth for [facility].   I connected with  Carlos Long on 06/09/18 by a video enabled telemedicine application and verified that I am speaking with the correct person using two identifiers. I discussed the limitations of evaluation and management by telemedicine. The patient expressed understanding and agreed to proceed.   Evaluation Performed:  Follow-up visit  Date:  06/09/2018   ID:  Carlos Long, DOB 09-19-1980, MRN [id number]  Patient Location:  [address] [place] [address]   Provider location:   [facility], [place] office  PCP:  Carlos Grana, FNP  Cardiologist:  Carlos Long   Chief Complaint:  Chest pain    History of Present Illness:    Carlos Long is a 38 y.o. male who presents via audio/video conferencing for a telehealth visit today.   The patient does not symptoms concerning for COVID-19 infection (fever, chills, cough, or new SHORTNESS OF BREATH).   Patient has a past medical history of HTN ?OSA/snoring Fm hx of CAD Who presents for f/u of his risk factors for coronary disease, strong family history  Friday , working night shift Hurt to breath Recently seen in the emergency room June 06, 2018 for atypical chest pain,Shortness of breath Hospital records reviewed with the patient in detail  sitting , developed sudden onset of chest pain tightness in the middle of his chest.  It is made worse by deep breathing.  As he was going home he developed increasing shortness of breath.  He has not had any coughing or fever.  He is never had anything like this before. White count was 15 Took some Motrin Cardiac enzymes EKG normal  Lasted 2 days, took motrin, felt better Little bit with a deep breath Symptoms were worse laying down, better sitting up  Lab work reviewed with him in detail Total chol 200, LDL 132 Does not want a cholesterol medication  Has 2 children, active at baseline Works in [employer] examiner  No shortness of breath,  No PND orthopnea  Blood pressure running high now up to amlodipine 10 mg daily Has not checked her blood pressure since increasing amlodipine up to 10  Strong family history discussed with him again in detail Dad with CABG, 76s, CVA nonsmoker, Mother dm II,   Dads brother, druugs and ETOH Dads other brother, smoker,  Dads mother pancreastic CA Grandfther: MI, CVA    Prior CV studies:   The following studies were reviewed today:    Past Medical History:  Diagnosis Date  . Asthma   . Frequent headaches   . Hypertension    Past Surgical History:  Procedure Laterality Date  . APPENDECTOMY       No outpatient medications have been marked as taking for the 06/10/18 encounter (Appointment) with Carlos Iba, MD.     Allergies:   Patient has no  known allergies.   Social History   Tobacco Use  . Smoking status: Never Smoker  . Smokeless tobacco: Never Used  Substance Use Topics  . Alcohol use: Yes    Alcohol/week: 0.0 standard drinks  . Drug use: No     Current Outpatient Medications on File Prior to Visit  Medication Sig Dispense Refill  . albuterol (PROVENTIL) (5 MG/ML) 0.5% nebulizer solution Take 0.5 mLs (2.5 mg total) by nebulization every 6 (six) hours as needed for wheezing or shortness of breath. (Patient not  taking: Reported on 06/09/2018) 20 mL 2  . amLODipine (NORVASC) 10 MG tablet Take 1 tablet (10 mg total) by mouth daily. 90 tablet 1  . Fluticasone-Salmeterol (ADVAIR DISKUS) 100-50 MCG/DOSE AEPB Inhale 1 puff into the lungs 2 (two) times daily. 60 each 3  . omeprazole (PRILOSEC) 20 MG capsule Take by mouth.     No current facility-administered medications on file prior to visit.      Family Hx: The patient's family history includes Alcohol abuse in his paternal grandfather and paternal uncle; Arthritis in his father; Cancer in his father and paternal uncle; Diabetes in his mother; Heart disease in his father, paternal grandfather, and paternal grandmother; Hyperlipidemia in his father; Hypertension in his father, paternal grandfather, and paternal grandmother; Pancreatic cancer in his paternal grandmother; Stroke in his paternal grandfather and paternal grandmother; Stroke (age of onset: 33) in his father. There is no history of Colon cancer.  ROS:   Please see the history of present illness.    Review of Systems  Constitutional: Negative.   Respiratory: Negative.   Cardiovascular: Positive for chest pain.  Gastrointestinal: Negative.   Musculoskeletal: Negative.   Neurological: Negative.   Psychiatric/Behavioral: Negative.   All other systems reviewed and are negative.     Labs/Other Tests and Data Reviewed:    Recent Labs: 11/04/2017: TSH 0.23 06/06/2018: ALT 43; B Natriuretic Peptide 15.0; BUN 17; Creatinine, Ser 0.88; Hemoglobin 14.5; Platelets 228; Potassium 3.8; Sodium 137   Recent Lipid Panel Lab Results  Component Value Date/Time   CHOL 200 (H) 10/18/2017 04:32 PM   TRIG 128 10/18/2017 04:32 PM   HDL 44 10/18/2017 04:32 PM   CHOLHDL 4.5 10/18/2017 04:32 PM   LDLCALC 132 (H) 10/18/2017 04:32 PM    Wt Readings from Last 3 Encounters:  06/06/18 197 lb (89.4 kg)  10/18/17 202 lb 4 oz (91.7 kg)  12/17/16 197 lb 8 oz (89.6 kg)     Exam:    Vital Signs: Vital signs may  also be detailed in the HPI There were no vitals taken for this visit.  Wt Readings from Last 3 Encounters:  06/06/18 197 lb (89.4 kg)  10/18/17 202 lb 4 oz (91.7 kg)  12/17/16 197 lb 8 oz (89.6 kg)   Temp Readings from Last 3 Encounters:  06/06/18 98.2 F (36.8 C) (Oral)  10/18/17 98.5 F (36.9 C) (Oral)  10/11/16 98.3 F (36.8 C) (Oral)   BP Readings from Last 3 Encounters:  06/06/18 (!) 144/99  10/18/17 (!) 130/110  12/17/16 (!) 140/96   Pulse Readings from Last 3 Encounters:  06/06/18 91  10/18/17 67  12/17/16 60    BP: 144/99 Recent pulse 90 Respirations 16  Well nourished, well developed male in no acute distress. Constitutional:  oriented to person, place, and time. No distress.  Head: Normocephalic and atraumatic.  Eyes:  no discharge. No scleral icterus.  Neck: Normal range of motion. Neck supple.  Pulmonary/Chest: No audible wheezing,  no distress, appears comfortable Musculoskeletal: Normal range of motion.  no  tenderness or deformity.  Neurological:   Coordination normal. Full exam not performed Skin:  No rash Psychiatric:  normal mood and affect. behavior is normal. Thought content normal.    ASSESSMENT & PLAN:    Chest pain, unspecified type Atypical in nature, unable to exclude pleurisy, pericarditis, mediastinitis  Symptoms resolved with NSAIDs There was a positional component worse laying supine better sitting up Recommended he call us if he continues to require NSAIDs on a regular basis  essential hypertension Recently amlodipine up to 10 mg daily Recommended he monitor blood pressure when he is not stressed, has not been running around Would call our office or Rennie PlowmanMargaret Arnett if pressure continues to run high  Mixed hyperlipidemia Cholesterol is high, discussed with him Of concern is the strong family history Last year and again today we have offered CT coronary calcium scoring, he has declined at this time  Family history of coronary  artery disease Risk factors discussed with him Screening studies discussed with him in detail He has declined any testing at this time   COVID-19 Education: The signs and symptoms of COVID-19 were discussed with the patient and how to seek care for testing (follow up with PCP or arrange E-visit).  The importance of social distancing was discussed today.  Patient Risk:   After full review of this patients clinical status, I feel that they are at least moderate risk at this time.  Time:   Today, I have spent 25 minutes with the patient with telehealth technology discussing the cardiac and medical problems/diagnoses detailed above   10 min spent reviewing the chart prior to patient visit today   Medication Adjustments/Labs and Tests Ordered: Current medicines are reviewed at length with the patient today.  Concerns regarding medicines are outlined above.   Tests Ordered: No tests ordered   Medication Changes: No changes made   Disposition: Follow-up as needed   Signed, Julien Nordmannimothy Channin Agustin, MD  06/09/2018 6:39 PM    [facility] [place] [facility] [address], [place], [place] [id number]

## 2018-06-09 NOTE — Progress Notes (Signed)
He is seeing him tomorrow (4/21). Melissa

## 2018-06-09 NOTE — Telephone Encounter (Signed)
Pending mychart response 

## 2018-06-09 NOTE — Patient Instructions (Addendum)
Increase amlodipine to 10mg   Monitor blood pressure,  Goal is less than 120/80, based on newest guidelines; if persistently higher, please make sooner follow up appointment so we can recheck you blood pressure and manage medications  Today we discussed referrals, orders. CARDIOLOGY, Dr Mariah Milling   I have placed these orders in the system for you.  Please be sure to give [place] a call in the next 1-2 days if you have not heard from our office regarding this.    Follow up this summer for physical and cholesterol.   Let me know if chest pain doesn't continue to improve.

## 2018-06-09 NOTE — Assessment & Plan Note (Signed)
Elevated.  Advised patient to go ahead and increase his amlodipine.  He will send me blood pressure readings from  home

## 2018-06-10 ENCOUNTER — Telehealth (INDEPENDENT_AMBULATORY_CARE_PROVIDER_SITE_OTHER): Payer: Managed Care, Other (non HMO) | Admitting: Cardiovascular Disease

## 2018-06-10 ENCOUNTER — Other Ambulatory Visit: Payer: Self-pay

## 2018-06-10 DIAGNOSIS — I10 Essential (primary) hypertension: Secondary | ICD-10-CM

## 2018-06-10 DIAGNOSIS — Z8249 Family history of ischemic heart disease and other diseases of the circulatory system: Secondary | ICD-10-CM

## 2018-06-10 DIAGNOSIS — R079 Chest pain, unspecified: Secondary | ICD-10-CM

## 2018-06-10 DIAGNOSIS — E782 Mixed hyperlipidemia: Secondary | ICD-10-CM

## 2018-06-10 NOTE — Telephone Encounter (Signed)
Virtual Visit Pre-Appointment Phone Call  Steps For Call:  1. Confirm consent - "In the setting of the current Covid19 crisis, you are scheduled for a (phone or video) visit with your provider on (date) at (time).  Just as we do with many in-office visits, in order for you to participate in this visit, we must obtain consent.  If you'd like, I can send this to your mychart (if signed up) or email for you to review.  Otherwise, I can obtain your verbal consent now.  All virtual visits are billed to your insurance company just like a normal visit would be.  By agreeing to a virtual visit, we'd like you to understand that the technology does not allow for your provider to perform an examination, and thus may limit your provider's ability to fully assess your condition. If your provider identifies any concerns that need to be evaluated in person, we will make arrangements to do so.  Finally, though the technology is pretty good, we cannot assure that it will always work on either your or our end, and in the setting of a video visit, we may have to convert it to a phone-only visit.  In either situation, we cannot ensure that we have a secure connection.  Are you willing to proceed?" STAFF: Did the patient verbally acknowledge consent to telehealth visit? Document YES/NO here: YES  2. Confirm the BEST phone number to call the day of the visit by including in appointment notes  3. Give patient instructions for MyChart download to smartphone OR Doximity/Doxy.me as below if video visit (depending on what platform provider is using)  4. Confirm that appointment type is correct in Epic appointment notes (VIDEO vs PHONE)  5. Advise patient to be prepared with their blood pressure, heart rate, weight, any heart rhythm information, their current medicines, and a piece of paper and pen handy for any instructions they may receive the day of their visit  6. Inform patient they will receive a phone call 15 minutes  prior to their appointment time (may be from unknown caller ID) so they should be prepared to answer    TELEPHONE CALL NOTE  Carlos Long has been deemed a candidate for a follow-up tele-health visit to limit community exposure during the Covid-19 pandemic. I spoke with the patient via phone to ensure availability of phone/video source, confirm preferred email & phone number, and discuss instructions and expectations.  I reminded Carlos Long to be prepared with any vital sign and/or heart rhythm information that could potentially be obtained via home monitoring, at the time of his visit. I reminded Carlos Long to expect a phone call prior to his visit.  Joline Maxcy 06/10/2018 8:29 AM   INSTRUCTIONS FOR DOWNLOADING THE MYCHART APP TO SMARTPHONE  - The patient must first make sure to have activated MyChart and know their login information - If Apple, go to [employer] and type in MyChart in the search bar and download the app. If Android, ask patient to go to [employer] and type in [place] in the search bar and download the app. The app is free but as with any other app downloads, their phone may require them to verify saved payment information or Apple/Android password.  - The patient will need to then log into the app with their MyChart username and password, and select [facility] as their healthcare provider to link the account. When it is time for your visit, go to the  MyChart app, find appointments, and click Begin Video Visit. Be sure to Select Allow for your device to access the Microphone and Camera for your visit. You will then be connected, and your provider will be with you shortly.  **If they have any issues connecting, or need assistance please contact MyChart service desk [phone])**  **If using a computer, in order to ensure the best quality for their visit they will need to use either of the following Internet Browsers: [employer], or  Google Chrome**  IF USING DOXIMITY or DOXY.ME - The patient will receive a link just prior to their visit by text.     FULL LENGTH CONSENT FOR TELE-HEALTH VISIT   I hereby voluntarily request, consent and authorize [facility] and its employed or contracted physicians, physician assistants, nurse practitioners or other licensed health care professionals (the Practitioner), to provide me with telemedicine health care services (the Services") as deemed necessary by the treating Practitioner. I acknowledge and consent to receive the Services by the Practitioner via telemedicine. I understand that the telemedicine visit will involve communicating with the Practitioner through live audiovisual communication technology and the disclosure of certain medical information by electronic transmission. I acknowledge that I have been given the opportunity to request an in-person assessment or other available alternative prior to the telemedicine visit and am voluntarily participating in the telemedicine visit.  I understand that I have the right to withhold or withdraw my consent to the use of telemedicine in the course of my care at any time, without affecting my right to future care or treatment, and that the Practitioner or I may terminate the telemedicine visit at any time. I understand that I have the right to inspect all information obtained and/or recorded in the course of the telemedicine visit and may receive copies of available information for a reasonable fee.  I understand that some of the potential risks of receiving the Services via telemedicine include:   Delay or interruption in medical evaluation due to technological equipment failure or disruption;  Information transmitted may not be sufficient (e.g. poor resolution of images) to allow for appropriate medical decision making by the Practitioner; and/or   In rare instances, security protocols could fail, causing a breach of personal health  information.  Furthermore, I acknowledge that it is my responsibility to provide information about my medical history, conditions and care that is complete and accurate to the best of my ability. I acknowledge that Practitioner's advice, recommendations, and/or decision may be based on factors not within their control, such as incomplete or inaccurate data provided by me or distortions of diagnostic images or specimens that may result from electronic transmissions. I understand that the practice of medicine is not an exact science and that Practitioner makes no warranties or guarantees regarding treatment outcomes. I acknowledge that I will receive a copy of this consent concurrently upon execution via email to the email address I last provided but may also request a printed copy by calling the office of [facility].    I understand that my insurance will be billed for this visit.   I have read or had this consent read to me.  I understand the contents of this consent, which adequately explains the benefits and risks of the Services being provided via telemedicine.   I have been provided ample opportunity to ask questions regarding this consent and the Services and have had my questions answered to my satisfaction.  I give my informed consent for the  services to be provided through the use of telemedicine in my medical care  By participating in this telemedicine visit I agree to the above.

## 2018-06-10 NOTE — Patient Instructions (Signed)

## 2018-06-19 ENCOUNTER — Encounter: Payer: Self-pay | Admitting: Family

## 2018-06-19 NOTE — Telephone Encounter (Signed)
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 Lewie Loron and is going to call him and let us know what he says about this chest pain, patient denies any pain at this moment, never has SOB with pain, no nausea, no jaw or arm pain.  Advised patient I did not feel comfortable unless he contacts Dr. Mariah Milling. Patient refused ED at this time but stated he will go if chest pain worsens or if develops any other symptom. But will contact Dr. Mariah Milling office today.

## 2018-06-20 ENCOUNTER — Telehealth: Payer: Self-pay | Admitting: Cardiovascular Disease

## 2018-06-20 ENCOUNTER — Telehealth: Payer: Self-pay

## 2018-06-20 NOTE — Telephone Encounter (Signed)
Patient PCP called and states patient called their office yesterday with chest pain, states he declined to come into their office or go to the ED. They advised him to call our office. Please call patient.

## 2018-06-20 NOTE — Telephone Encounter (Signed)
Noted  

## 2018-06-20 NOTE — Telephone Encounter (Signed)
Call to patient. He reports chest discomfort yesterday. Called PCP and was asked to call our office. Pt refused.   Pt reports that he took Motrin yesterday and all sx resolved and have not returned.   He had a telephone call with his PCP again today.  Advised pt to call for any further questions or concerns or if sx return.

## 2018-06-20 NOTE — Telephone Encounter (Signed)
Has patient been contacted? 

## 2018-06-20 NOTE — Telephone Encounter (Signed)
Per the note in pt's chart, pt stated that he has not had any chest pain last night or today. Pt did  Not feel the need to be seen today. Pt was advised if he had any chest pain over the weekend to go seek medical help.

## 2018-06-20 NOTE — Telephone Encounter (Signed)
Please call patient and ask if would be willing to do virtual visit today to discuss symptoms  We can also have him come into the office so we can get an EKG as long as he is not having fever or cough; he can wear mask.  We need to at least lay eyes on him via video; coming into office for visit and ekg would be better.

## 2018-06-20 NOTE — Telephone Encounter (Signed)
I called patient LMTCB in regards to [employer]. He needs to make appointment ASAP or go to ED.

## 2018-09-29 NOTE — Telephone Encounter (Signed)
Spoke with patient regarding form which needs completion for his work. Reviewed that he needs to check blood pressures and keep a log of those readings so that we can see if they are better controlled because based on numbers in Epic they are elevated. Reviewed that he would want to see those numbers and he can send [place] through [employer]. He verbalized understanding with no further questions. Let him know that I would place form on providers desk and route message as well. He verbalized understanding with no further questions at this time.

## 2018-09-29 NOTE — Patient Instructions (Signed)

## 2018-09-30 NOTE — Telephone Encounter (Signed)
Would agree that we need to see some home blood pressure measurements to make sure they are well controlled Previous numbers taken in the office and emergency room may not be is baseline

## 2018-09-30 NOTE — Telephone Encounter (Signed)
Spoke with patient and reviewed provider recommendations. He verbalized understanding and has one blood pressure reading from 09/29/18 at 4:00 PM of 134/88. Requested that he keep monitoring and send [place] a mychart message with his blood pressure readings after a few days and provider will review and determine if additional information needed. He verbalized understanding with no further questions at this time.

## 2019-01-19 ENCOUNTER — Other Ambulatory Visit: Payer: Self-pay

## 2019-01-19 ENCOUNTER — Ambulatory Visit: Payer: Self-pay | Admitting: Family

## 2019-01-19 VITALS — BP 140/88 | HR 76 | Temp 98.0°F | Resp 18 | Wt 210.0 lb

## 2019-01-19 DIAGNOSIS — M25561 Pain in right knee: Secondary | ICD-10-CM

## 2019-01-19 DIAGNOSIS — G8929 Other chronic pain: Secondary | ICD-10-CM

## 2019-01-19 NOTE — Progress Notes (Signed)
Subjective:    Carlos Long is a 38 y.o. male who presents with knee pain involving the right knee. has had chronic knee pain for a few years, but has worsened in the last 5 days.   Inciting event: none known. Current symptoms include: crepitus sensation and pain located in the kneepit. Pain is aggravated by going up and down stairs and rising after sitting. Patient has had prior knee problems. Evaluation to date: none. Treatment to date: none. History of lower back pain, not present today.   The following portions of the patient's history were reviewed and updated as appropriate: He  has a past medical history of Asthma, Frequent headaches, and Hypertension..   Review of Systems Knee pain, with Full ROM.   History of lower back pain, not present today.    Objective:    BP 140/88 (BP Location: Right Arm, Patient Position: Sitting, Cuff Size: Normal)   Pulse 76   Temp 98 F (36.7 C) (Temporal)   Resp 18   Wt 210 lb (95.3 kg)   SpO2 98%   BMI 29.29 kg/m  Right knee: positive exam findings: tenderness noted in rt kneepit  Left knee:  no effusion, full active range of motion, no joint line tenderness, ligamentous structures intact.     Muscles of legs tight on palpation.  No redness warmth or swelling noted to back of leg or calf.  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 Ibuprofen as needed.    Encouraged lower back and leg stretching.   Can also use lumbar support pillow in work vehicle.

## 2019-01-23 ENCOUNTER — Ambulatory Visit: Payer: Managed Care, Other (non HMO) | Admitting: Family Medicine

## 2019-02-11 ENCOUNTER — Ambulatory Visit (INDEPENDENT_AMBULATORY_CARE_PROVIDER_SITE_OTHER): Payer: Managed Care, Other (non HMO) | Admitting: Family

## 2019-02-11 ENCOUNTER — Encounter: Payer: Self-pay | Admitting: Family

## 2019-02-11 ENCOUNTER — Other Ambulatory Visit: Payer: Self-pay

## 2019-02-11 DIAGNOSIS — I10 Essential (primary) hypertension: Secondary | ICD-10-CM

## 2019-02-11 DIAGNOSIS — G8929 Other chronic pain: Secondary | ICD-10-CM | POA: Diagnosis not present

## 2019-02-11 DIAGNOSIS — M25561 Pain in right knee: Secondary | ICD-10-CM | POA: Insufficient documentation

## 2019-02-11 MED ORDER — MELOXICAM 7.5 MG PO TABS: 7.5000 mg | ORAL_TABLET | Freq: Every day | ORAL | 1 refills | Status: DC | PRN

## 2019-02-11 NOTE — Assessment & Plan Note (Signed)
Certainly limited by inability to examine patient today.  However based on symptoms and history, I do consider arthritis of knee playing a role, possibly meniscal etiology as he describes instability and lastly Baker's cyst based on the posterior nature of the pain.  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 We jointly agreed on conservative therapy including icing regimen, meloxicam as needed and referral to orthopedics.  Will follow

## 2019-02-11 NOTE — Assessment & Plan Note (Addendum)
BP Readings from Last 3 Encounters:  01/19/19 140/88  06/06/18 (!) 144/99  10/18/17 (!) 130/110   Presume improved after weight loss however discussed with patient goal of < 120/80 and patient will send me readings from home.

## 2019-02-11 NOTE — Progress Notes (Signed)
Virtual Visit via Video Note  I connected with@  on 02/11/19 at 11:00 AM EST by a video enabled telemedicine application and verified that I am speaking with the correct person using two identifiers.  Location patient: home Location provider:work  Persons participating in the virtual visit: patient, provider  I discussed the limitations of evaluation and management by telemedicine and the availability of in person appointments. The patient expressed understanding and agreed to proceed. Interactive audio and video telecommunications were attempted between this provider and patient, however failed, due to patient having technical difficulties or patient did not have access to video capability.  We continued and completed visit with audio only.    HPI:  Right knee pain worsened over the past month.  Pain started posterior, and noticed it when sitting with legs crossed. Has been wearing brace and thinks helps 'with over extending.' Notes under the 'knee cap' feels tender. Most of days is 'aching'. Pain with rest, worse with activity. worses after long periods of walking. No falls, knee given out however right knee does feel 'some instability.' Endorses popping sounds.  No fever, erythema, rash, bruising, swelling. Skin intact.  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   No known injury. Not taking NSAIDS. No h/o gi bleeds. Has tried heat with some relief.   H/o of hyperextention of both knees in highschool. 'always had knee problems'   HTN-compliant with medication.  hasnt checked in some time. Feels improved as hasnt had headaches. Has also lost weight through dietary changes.  Denies exertional chest pain or pressure, numbness or tingling radiating to left arm or jaw, palpitations, dizziness, frequent headaches, changes in vision, or shortness of breath.   Consult cardiology, Dr. Mariah Milling  for 06/10/2018   ROS: See pertinent positives and negatives per HPI.  Past Medical History:   Diagnosis Date  . Asthma   . Frequent headaches   . Hypertension     Past Surgical History:  Procedure Laterality Date  . APPENDECTOMY      Family History  Problem Relation Age of Onset  . Diabetes Mother   . Arthritis Father   . Cancer Father        prostate  . Hyperlipidemia Father   . Hypertension Father   . Stroke Father 33  . Heart disease Father        CABG x 3  . Alcohol abuse Paternal Uncle   . Cancer Paternal Uncle        prostate  . Heart disease Paternal Grandmother   . Hypertension Paternal Grandmother   . Stroke Paternal Grandmother   . Pancreatic cancer Paternal Grandmother   . Alcohol abuse Paternal Grandfather   . Heart disease Paternal Grandfather   . Hypertension Paternal Grandfather   . Stroke Paternal Grandfather   . Colon cancer Neg Hx     SOCIAL HX: never smoker   Current Outpatient Medications:  .  albuterol (PROVENTIL) (5 MG/ML) 0.5% nebulizer solution, Take 0.5 mLs (2.5 mg total) by nebulization every 6 (six) hours as needed for wheezing or shortness of breath., Disp: 20 mL, Rfl: 2 .  amLODipine (NORVASC) 10 MG tablet, Take 1 tablet (10 mg total) by mouth daily., Disp: 90 tablet, Rfl: 1 .  Fluticasone-Salmeterol (ADVAIR DISKUS) 100-50 MCG/DOSE AEPB, Inhale 1 puff into the lungs 2 (two) times daily., Disp: 60 each, Rfl: 3 .  meloxicam (MOBIC) 7.5 MG tablet, Take 1 tablet (7.5 mg total) by mouth daily as needed for pain., Disp: 30 tablet,  Rfl: 1 .  omeprazole (PRILOSEC) 20 MG capsule, Take by mouth., Disp: , Rfl:  .  ASSESSMENT AND PLAN:  Discussed the following assessment and plan:  Chronic pain of right knee - Plan: meloxicam (MOBIC) 7.5 MG tablet, Ambulatory referral to Orthopedic Surgery  Essential hypertension Problem List Items Addressed This Visit      Cardiovascular and Mediastinum   HTN (hypertension)    BP Readings from Last 3 Encounters:  01/19/19 140/88  06/06/18 (!) 144/99  10/18/17 (!) 130/110   Presume improved  after weight loss however discussed with patient goal of < 120/80 and patient will send me readings from home.        Other   Right knee pain - Primary    Certainly limited by inability to examine patient today.  However based on symptoms and history, I do consider arthritis of knee playing a role, possibly meniscal etiology as he describes instability and lastly Baker's cyst based on the posterior nature of the pain.  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 We jointly agreed on conservative therapy including icing regimen, meloxicam as needed and referral to orthopedics.  Will follow      Relevant Medications   meloxicam (MOBIC) 7.5 MG tablet   Other Relevant Orders   Ambulatory referral to Orthopedic Surgery      -we discussed possible serious and likely etiologies, options for evaluation and workup, limitations of telemedicine visit vs in person visit, treatment, treatment risks and precautions. Pt prefers to treat via telemedicine empirically rather then risking or undertaking an in person visit at this moment. Patient agrees to seek prompt in person care if worsening, new symptoms arise, or if is not improving with treatment.   I discussed the assessment and treatment plan with the patient. The patient was provided an opportunity to ask questions and all were answered. The patient agreed with the plan and demonstrated an understanding of the instructions.   The patient was advised to call back or seek an in-person evaluation if the symptoms worsen or if the condition fails to improve as anticipated.   Mable Paris, FNP

## 2019-02-19 ENCOUNTER — Ambulatory Visit: Payer: Managed Care, Other (non HMO) | Admitting: Family Medicine

## 2019-03-04 ENCOUNTER — Encounter: Payer: Self-pay | Admitting: Family Medicine

## 2019-03-04 ENCOUNTER — Ambulatory Visit: Payer: Self-pay

## 2019-03-04 ENCOUNTER — Other Ambulatory Visit: Payer: Self-pay

## 2019-03-04 ENCOUNTER — Ambulatory Visit: Payer: Managed Care, Other (non HMO) | Admitting: Family Medicine

## 2019-03-04 DIAGNOSIS — M25561 Pain in right knee: Secondary | ICD-10-CM

## 2019-03-04 DIAGNOSIS — G8929 Other chronic pain: Secondary | ICD-10-CM | POA: Diagnosis not present

## 2019-03-04 DIAGNOSIS — J45909 Unspecified asthma, uncomplicated: Secondary | ICD-10-CM | POA: Insufficient documentation

## 2019-03-04 DIAGNOSIS — J302 Other seasonal allergic rhinitis: Secondary | ICD-10-CM | POA: Insufficient documentation

## 2019-03-04 NOTE — Progress Notes (Signed)
Office Visit Note   Patient: Carlos Long           Date of Birth: [birth year]           MRN: [id number] Visit Date: 03/04/2019 Requested by: Allegra Grana, FNP [address] [place],  [place] [address] PCP: Allegra Grana, FNP  Subjective: Chief Complaint  Patient presents with  . Right Knee - Pain    Past sports injuries (hyperextension of knees, ie). This flareup of pain started 1&1/2 - 2 months ago. NKI. +popping, +some occasional swelling, +rarely gives way, -locking. Wearing a brace - helps the posterior knee when walking.    HPI: He is here with right knee pain.  Symptoms started 2 or 3 months ago, no definite injury.  Initially he felt pain in the posterior knee but now it is more on the anteromedial aspect.  He purchased a knee brace which helped with the posterior pain.  Denies any locking or giving way but it does pop frequently.  He has tried meloxicam with some improvement.  He has tried ice and relative rest.  In the past he worked for [employer] and that job was physically demanding.  He does not recall a specific injury to the knee at that time, but he wonders whether that might of contributed.  Now he works as a Development worker, community.              ROS:   All other systems were reviewed and are negative.  Objective: Vital Signs: There were no vitals taken for this visit.  Physical Exam:  General:  Alert and oriented, in no acute distress. Pulm:  Breathing unlabored. Psy:  Normal mood, congruent affect. Skin: No erythema or rash. Right knee: Trace patellofemoral crepitus, trace effusion with no warmth.  No pain with patella compression or apprehension.  Lachman's is solid, no laxity with varus/valgus stress.  He is very tender on the posterior medial joint line and has pain as well as a click with McMurray's.  No palpable popliteal cyst.  Imaging: X-rays right knee: He has very early patellofemoral spurring but overall joint spaces look good.  No sign  of loose body, no sign of stress fracture.    Assessment & Plan: 1.  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  Suspect medial meniscus tear. -Discussed options with him and elected to treat with straight leg raises for strengthening, hamstring stretches, glucosamine, turmeric and vitamin D3. -If symptoms worsen he will contact me and I will order MRI scan.     Procedures: No procedures performed  No notes on file     PMFS History: Patient Active Problem List   Diagnosis Date Noted  . Asthma 03/04/2019  . Seasonal allergic rhinitis 03/04/2019  . Right knee pain 02/11/2019  . Chest pain 06/09/2018  . Routine physical examination 10/18/2017  . Other fatigue 10/18/2017  . Mild intermittent asthma without complication 10/18/2017  . GERD (gastroesophageal reflux disease) 10/18/2017  . Family history of coronary artery disease 12/17/2016  . Mixed hyperlipidemia 12/17/2016  . HTN (hypertension) 10/11/2016  . OSA (obstructive sleep apnea) 10/11/2016   Past Medical History:  Diagnosis Date  . Asthma   . Frequent headaches   . Hypertension     Family History  Problem Relation Age of Onset  . Diabetes Mother   . Arthritis Father   . Cancer Father  prostate  . Hyperlipidemia Father   . Hypertension Father   . Stroke Father 6  . Heart disease Father        CABG x 3  . Alcohol abuse Paternal Uncle   . Cancer Paternal Uncle        prostate  . Heart disease Paternal Grandmother   . Hypertension Paternal Grandmother   . Stroke Paternal Grandmother   . Pancreatic cancer Paternal Grandmother   . Alcohol abuse Paternal Grandfather   . Heart disease Paternal Grandfather   . Hypertension Paternal Grandfather   . Stroke Paternal Grandfather   . Colon cancer Neg Hx     Past Surgical History:  Procedure Laterality Date  . APPENDECTOMY     Social History   Occupational History  .  Not on file  Tobacco Use  . Smoking status: Never Smoker  . Smokeless tobacco: Never Used  Substance and Sexual Activity  . Alcohol use: Yes    Alcohol/week: 0.0 standard drinks  . Drug use: No  . Sexual activity: Yes    Partners: Female

## 2019-04-11 ENCOUNTER — Other Ambulatory Visit: Payer: Self-pay | Admitting: Family

## 2019-04-11 DIAGNOSIS — G8929 Other chronic pain: Secondary | ICD-10-CM

## 2019-04-18 IMAGING — DX PORTABLE CHEST - 1 VIEW
1 series · 1 of 1 positions shown · non-contrast
Comparison: None.

CLINICAL DATA: Chest pain and shortness of breath

EXAM:
PORTABLE CHEST 1 VIEW

[chest ap]
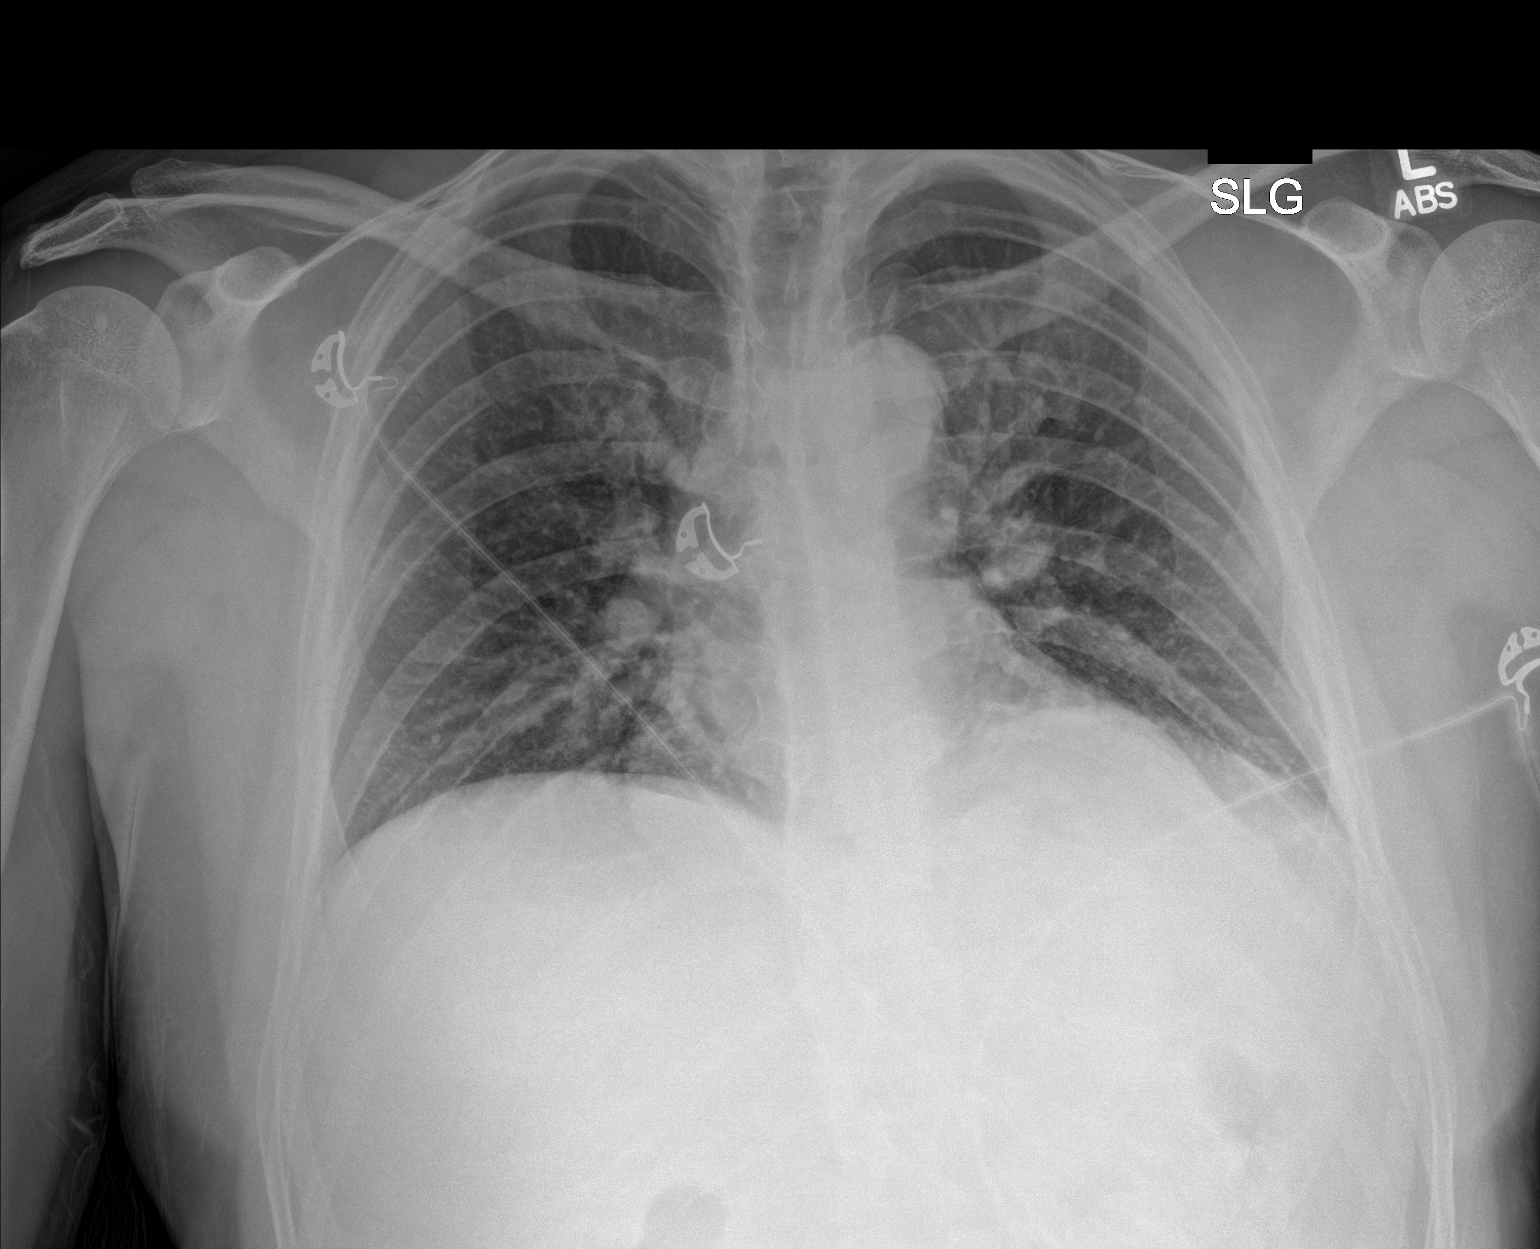

[1 of 1 positions shown; findings below may reference images not displayed]

FINDINGS: Lungs are clear. Heart size and pulmonary vascularity are normal. No
adenopathy. No pneumothorax. No bone lesions.
IMPRESSION: No edema or consolidation.

## 2019-06-13 ENCOUNTER — Other Ambulatory Visit: Payer: Self-pay | Admitting: Family

## 2019-06-13 DIAGNOSIS — I159 Secondary hypertension, unspecified: Secondary | ICD-10-CM

## 2019-06-30 ENCOUNTER — Encounter: Payer: Self-pay | Admitting: Family

## 2019-07-03 ENCOUNTER — Ambulatory Visit: Payer: Managed Care, Other (non HMO) | Admitting: Family

## 2019-07-03 ENCOUNTER — Encounter: Payer: Self-pay | Admitting: Family

## 2019-07-03 ENCOUNTER — Other Ambulatory Visit: Payer: Self-pay

## 2019-07-03 VITALS — BP 130/98 | HR 79 | Temp 97.9°F | Ht 71.0 in | Wt 205.4 lb

## 2019-07-03 DIAGNOSIS — R21 Rash and other nonspecific skin eruption: Secondary | ICD-10-CM | POA: Diagnosis not present

## 2019-07-03 DIAGNOSIS — F419 Anxiety disorder, unspecified: Secondary | ICD-10-CM | POA: Diagnosis not present

## 2019-07-03 DIAGNOSIS — I10 Essential (primary) hypertension: Secondary | ICD-10-CM

## 2019-07-03 MED ORDER — LOSARTAN POTASSIUM 50 MG PO TABS: 50.0000 mg | ORAL_TABLET | Freq: Every evening | ORAL | 3 refills | Status: DC

## 2019-07-03 NOTE — Patient Instructions (Addendum)
Start losartan.  It Will take about 1 week for you to start to notice blood pressure improving.  Labs in 1 week as discussed.  Please stay vigilant and if you have any further concerns or new symptoms in regards to rash, please let me know immediately.  It is imperative that you are seen AT least twice per year for labs and monitoring. Monitor blood pressure at home and me 5-6 reading on separate days. Goal is less than 120/80, based on newest guidelines, however we certainly want to be less than 130/80;  if persistently higher, please make sooner follow up appointment so we can recheck you blood pressure and manage/ adjust medications.  Managing Your Hypertension Hypertension is commonly called high blood pressure. This is when the force of your blood pressing against the walls of your arteries is too strong. Arteries are blood vessels that carry blood from your heart throughout your body. Hypertension forces the heart to work harder to pump blood, and may cause the arteries to become narrow or stiff. Having untreated or uncontrolled hypertension can cause heart attack, stroke, kidney disease, and other problems. What are blood pressure readings? A blood pressure reading consists of a higher number over a lower number. Ideally, your blood pressure should be below 120/80. The first ("top") number is called the systolic pressure. It is a measure of the pressure in your arteries as your heart beats. The second ("bottom") number is called the diastolic pressure. It is a measure of the pressure in your arteries as the heart relaxes. What does my blood pressure reading mean? Blood pressure is classified into four stages. Based on your blood pressure reading, your health care provider may use the following stages to determine what type of treatment you need, if any. Systolic pressure and diastolic pressure are measured in a unit called mm Hg. Normal  Systolic pressure: below 120.  Diastolic pressure: below  80. Elevated  Systolic pressure: 120-129.  Diastolic pressure: below 80. Hypertension stage 1  Systolic pressure: 130-139.  Diastolic pressure: 80-89. Hypertension stage 2  Systolic pressure: 140 or above.  Diastolic pressure: 90 or above. What health risks are associated with hypertension? Managing your hypertension is an important responsibility. Uncontrolled hypertension can lead to:  A heart attack.  A stroke.  A weakened blood vessel (aneurysm).  Heart failure.  Kidney damage.  Eye damage.  Metabolic syndrome.  Memory and concentration problems. What changes can I make to manage my hypertension? Hypertension can be managed by making lifestyle changes and possibly by taking medicines. Your health care provider will help you make a plan to bring your blood pressure within a normal range. Eating and drinking   Eat a diet that is high in fiber and potassium, and low in salt (sodium), added sugar, and fat. An example eating plan is called the DASH (Dietary Approaches to Stop Hypertension) diet. To eat this way: ? Eat plenty of fresh fruits and vegetables. Try to fill half of your plate at each meal with fruits and vegetables. ? Eat whole grains, such as whole wheat pasta, brown rice, or whole grain bread. Fill about one quarter of your plate with whole grains. ? Eat low-fat diary products. ? Avoid fatty cuts of meat, processed or cured meats, and poultry with skin. Fill about one quarter of your plate with lean proteins such as fish, chicken without skin, beans, eggs, and tofu. ? Avoid premade and processed foods. These tend to be higher in sodium, added sugar, and fat.  Reduce your daily sodium intake. Most people with hypertension should eat less than 1,500 mg of sodium a day.  Limit alcohol intake to no more than 1 drink a day for nonpregnant women and 2 drinks a day for men. One drink equals 12 oz of beer, 5 oz of wine, or 1 oz of hard liquor. Lifestyle  Work  with your health care provider to maintain a healthy body weight, or to lose weight. Ask what an ideal weight is for you.  Get at least 30 minutes of exercise that causes your heart to beat faster (aerobic exercise) most days of the week. Activities may include walking, swimming, or biking.  Include exercise to strengthen your muscles (resistance exercise), such as weight lifting, as part of your weekly exercise routine. Try to do these types of exercises for 30 minutes at least 3 days a week.  Do not use any products that contain nicotine or tobacco, such as cigarettes and e-cigarettes. If you need help quitting, ask your health care provider.  Control any long-term (chronic) conditions you have, such as high cholesterol or diabetes. Monitoring  Monitor your blood pressure at home as told by your health care provider. Your personal target blood pressure may vary depending on your medical conditions, your age, and other factors.  Have your blood pressure checked regularly, as often as told by your health care provider. Working with your health care provider  Review all the medicines you take with your health care provider because there may be side effects or interactions.  Talk with your health care provider about your diet, exercise habits, and other lifestyle factors that may be contributing to hypertension.  Visit your health care provider regularly. Your health care provider can help you create and adjust your plan for managing hypertension. Will I need medicine to control my blood pressure? Your health care provider may prescribe medicine if lifestyle changes are not enough to get your blood pressure under control, and if:  Your systolic blood pressure is 130 or higher.  Your diastolic blood pressure is 80 or higher. Take medicines only as told by your health care provider. Follow the directions carefully. Blood pressure medicines must be taken as prescribed. The medicine does not  work as well when you skip doses. Skipping doses also puts you at risk for problems. Contact a health care provider if:  You think you are having a reaction to medicines you have taken.  You have repeated (recurrent) headaches.  You feel dizzy.  You have swelling in your ankles.  You have trouble with your vision. Get help right away if:  You develop a severe headache or confusion.  You have unusual weakness or numbness, or you feel faint.  You have severe pain in your chest or abdomen.  You vomit repeatedly.  You have trouble breathing. Summary  Hypertension is when the force of blood pumping through your arteries is too strong. If this condition is not controlled, it may put you at risk for serious complications.  Your personal target blood pressure may vary depending on your medical conditions, your age, and other factors. For most people, a normal blood pressure is less than 120/80.  Hypertension is managed by lifestyle changes, medicines, or both. Lifestyle changes include weight loss, eating a healthy, low-sodium diet, exercising more, and limiting alcohol. This information is not intended to replace advice given to you by your health care provider. Make sure you discuss any questions you have with your health care provider.  Document Revised: 05/30/2018 Document Reviewed: 01/04/2016 Elsevier Patient Education  [place].

## 2019-07-03 NOTE — Progress Notes (Signed)
Subjective:    Patient ID: Carlos Long, male    DOB: February 17, 1981, 39 y.o.   MRN: [id number]  CC: Carlos Long is a 39 y.o. male who presents today for an acute visit.    HPI: Complains of rash x one month, comes and goes.  Onset is abrupt and then gradually resolves on its own. Has had 2 episodes in total.  No new medication Does note new fabric softener and now and  No increased warmth, nonpurititic   H/o asthma No h/o eczema.  No food, drug allergies.   Asthma - very controlled. hasnt needed inhalers.   cipap- compliant with cipap.   Increased anxiety of late. Has been speaking with pastor.  Some trouble sleeping and will have trouble staying asleep. No hi/si.   htn- compliant with amlodipine. Denies exertional chest pain or pressure, numbness or tingling radiating to left arm or jaw, palpitations, dizziness, frequent headaches, changes in vision, or shortness of breath.   Mobic is very rare.         HISTORY:  Past Medical History:  Diagnosis Date  . Asthma   . Frequent headaches   . Hypertension    Past Surgical History:  Procedure Laterality Date  . APPENDECTOMY     Family History  Problem Relation Age of Onset  . Diabetes Mother   . Arthritis Father   . Cancer Father        prostate  . Hyperlipidemia Father   . Hypertension Father   . Stroke Father 23  . Heart disease Father        CABG x 3  . Alcohol abuse Paternal Uncle   . Cancer Paternal Uncle        prostate  . Heart disease Paternal Grandmother   . Hypertension Paternal Grandmother   . Stroke Paternal Grandmother   . Pancreatic cancer Paternal Grandmother   . Alcohol abuse Paternal Grandfather   . Heart disease Paternal Grandfather   . Hypertension Paternal Grandfather   . Stroke Paternal Grandfather   . Colon cancer Neg Hx     Allergies: Patient has no known allergies. Current Outpatient Medications on File Prior to Visit  Medication Sig Dispense Refill  . amLODipine  (NORVASC) 10 MG tablet TAKE 1 TABLET BY MOUTH EVERY DAY 90 tablet 1  . omeprazole (PRILOSEC) 20 MG capsule Take by mouth.    Marland Kitchen albuterol (PROVENTIL) (5 MG/ML) 0.5% nebulizer solution Take 0.5 mLs (2.5 mg total) by nebulization every 6 (six) hours as needed for wheezing or shortness of breath. (Patient not taking: Reported on 07/03/2019) 20 mL 2  . Fluticasone-Salmeterol (ADVAIR DISKUS) 100-50 MCG/DOSE AEPB Inhale 1 puff into the lungs 2 (two) times daily. (Patient not taking: Reported on 07/03/2019) 60 each 3  . meloxicam (MOBIC) 7.5 MG tablet TAKE 1 TABLET BY MOUTH DAILY AS NEEDED FOR PAIN 30 tablet 1   No current facility-administered medications on file prior to visit.    Social History   Tobacco Use  . Smoking status: Never Smoker  . Smokeless tobacco: Never Used  Substance Use Topics  . Alcohol use: Yes    Alcohol/week: 0.0 standard drinks  . Drug use: No    Review of Systems  Constitutional: Negative for chills and fever.  Respiratory: Negative for cough and shortness of breath.   Cardiovascular: Negative for chest pain, palpitations and leg swelling.  Gastrointestinal: Negative for nausea and vomiting.  Musculoskeletal: Negative for arthralgias and myalgias.  Skin: Positive for rash. Negative for  color change and wound.  Neurological: Negative for numbness.      Objective:    BP (!) 130/98 (BP Location: Right Arm, Patient Position: Sitting, Cuff Size: Large)   Pulse 79   Temp 97.9 F (36.6 C) (Temporal)   Ht 5\' 11"  (1.803 m)   Wt 205 lb 6.4 oz (93.2 kg)   SpO2 97%   BMI 28.65 kg/m   BP Readings from Last 3 Encounters:  07/03/19 (!) 130/98  01/19/19 140/88  06/06/18 (!) 144/99   Wt Readings from Last 3 Encounters:  07/03/19 205 lb 6.4 oz (93.2 kg)  01/19/19 210 lb (95.3 kg)  06/06/18 197 lb (89.4 kg)    Physical Exam Vitals reviewed.  Constitutional:      Appearance: He is well-developed.  Cardiovascular:     Rate and Rhythm: Regular rhythm.     Heart  sounds: Normal heart sounds.  Pulmonary:     Effort: Pulmonary effort is normal. No respiratory distress.     Breath sounds: Normal breath sounds. No wheezing, rhonchi or rales.  Skin:    General: Skin is warm and dry.     Comments: Nonblanchable lace like rash bilateral legs. No vesicular lesions.   Neurological:     Mental Status: He is alert.  Psychiatric:        Speech: Speech normal.        Behavior: Behavior normal.         Assessment & Plan:   Problem List Items Addressed This Visit      Cardiovascular and Mediastinum   HTN (hypertension) - Primary    Elevated.  Start losartan. Repeat labs in one week. Close f/u      Relevant Medications   losartan (COZAAR) 50 MG tablet   Other Relevant Orders   Comprehensive metabolic panel   Lipid panel   TSH   VITAMIN D 25 Hydroxy (Vit-D Deficiency, Fractures)   CBC with Differential/Platelet   Hemoglobin A1c     Musculoskeletal and Integument   Rash    Etiology uncertain at this time.  No systemic features and rash is improving at this time . question whether amlodipine.  Sending message dermatology in this regard.      Relevant Orders   Comprehensive metabolic panel   TSH   CBC with Differential/Platelet     Other   Anxiety    Controlled.  Is able to confide in his wife and also his pastor. Politely declines any medications in regardsto  anxiety or formal referral to counselor. Will follow            I am having 06/08/18 "Carlos Long" start on losartan. I am also having him maintain his omeprazole, Fluticasone-Salmeterol, albuterol, meloxicam, and amLODipine.   Meds ordered this encounter  Medications  . losartan (COZAAR) 50 MG tablet    Sig: Take 1 tablet (50 mg total) by mouth every evening.    Dispense:  90 tablet    Refill:  3    Order Specific Question:   Supervising Provider    Answer:   [name] [[id number]]    Return precautions given.   Risks, benefits, and alternatives of the medications  and treatment plan prescribed today were discussed, and patient expressed understanding.   Education regarding symptom management and diagnosis given to patient on AVS.  Continue to follow with Sherlene Shams, FNP for routine health maintenance.   Allegra Grana and I agreed with plan.   [name], FNP

## 2019-07-03 NOTE — Assessment & Plan Note (Signed)
Controlled.  Is able to confide in his wife and also his pastor. Politely declines any medications in regardsto  anxiety or formal referral to counselor. Will follow

## 2019-07-03 NOTE — Assessment & Plan Note (Signed)
Elevated.  Start losartan. Repeat labs in one week. Close f/u

## 2019-07-03 NOTE — Assessment & Plan Note (Addendum)
Etiology uncertain at this time.  No systemic features and rash is improving at this time . question whether amlodipine.  Sending message dermatology in this regard.

## 2019-07-10 ENCOUNTER — Other Ambulatory Visit (INDEPENDENT_AMBULATORY_CARE_PROVIDER_SITE_OTHER): Payer: Managed Care, Other (non HMO)

## 2019-07-10 ENCOUNTER — Other Ambulatory Visit: Payer: Self-pay

## 2019-07-10 DIAGNOSIS — I10 Essential (primary) hypertension: Secondary | ICD-10-CM | POA: Diagnosis not present

## 2019-07-10 DIAGNOSIS — R21 Rash and other nonspecific skin eruption: Secondary | ICD-10-CM | POA: Diagnosis not present

## 2019-07-10 LAB — LIPID PANEL
Cholesterol: 214 mg/dL — ABNORMAL HIGH (ref 0–200)
HDL: 44.7 mg/dL (ref >39.00)
LDL Cholesterol: 156 mg/dL — ABNORMAL HIGH (ref 0–99)
NonHDL: 168.8
Total CHOL/HDL Ratio: 5
Triglycerides: 66 mg/dL (ref 0.0–149.0)
VLDL: 13.2 mg/dL (ref 0.0–40.0)

## 2019-07-10 LAB — CBC WITH DIFFERENTIAL/PLATELET
Basophils Absolute: 0.1 10*3/uL (ref 0.0–0.1)
Basophils Relative: 0.9 % (ref 0.0–3.0)
Eosinophils Absolute: 0.2 10*3/uL (ref 0.0–0.7)
Eosinophils Relative: 2.9 % (ref 0.0–5.0)
HCT: 44.5 % (ref 39.0–52.0)
Hemoglobin: 15 g/dL (ref 13.0–17.0)
Lymphocytes Relative: 26.5 % (ref 12.0–46.0)
Lymphs Abs: 1.4 10*3/uL (ref 0.7–4.0)
MCHC: 33.7 g/dL (ref 30.0–36.0)
MCV: 87.5 fl (ref 78.0–100.0)
Monocytes Absolute: 0.4 10*3/uL (ref 0.1–1.0)
Monocytes Relative: 7.7 % (ref 3.0–12.0)
Neutro Abs: 3.4 10*3/uL (ref 1.4–7.7)
Neutrophils Relative %: 62 % (ref 43.0–77.0)
Platelets: 235 10*3/uL (ref 150.0–400.0)
RBC: 5.08 Mil/uL (ref 4.22–5.81)
RDW: 13 % (ref 11.5–15.5)
WBC: 5.5 10*3/uL (ref 4.0–10.5)

## 2019-07-10 LAB — COMPREHENSIVE METABOLIC PANEL
ALT: 32 U/L (ref 0–53)
AST: 22 U/L (ref 0–37)
Albumin: 4.8 g/dL (ref 3.5–5.2)
Alkaline Phosphatase: 76 U/L (ref 39–117)
BUN: 15 mg/dL (ref 6–23)
CO2: 30 mEq/L (ref 19–32)
Calcium: 9.8 mg/dL (ref 8.4–10.5)
Chloride: 104 mEq/L (ref 96–112)
Creatinine, Ser: 0.96 mg/dL (ref 0.40–1.50)
GFR: 86.18 mL/min (ref >60.00)
Glucose, Bld: 105 mg/dL — ABNORMAL HIGH (ref 70–99)
Potassium: 4.4 mEq/L (ref 3.5–5.1)
Sodium: 138 mEq/L (ref 135–145)
Total Bilirubin: 0.8 mg/dL (ref 0.2–1.2)
Total Protein: 7.3 g/dL (ref 6.0–8.3)

## 2019-07-10 LAB — VITAMIN D 25 HYDROXY (VIT D DEFICIENCY, FRACTURES): VITD: 31.54 ng/mL (ref 30.00–100.00)

## 2019-07-10 LAB — TSH: TSH: 0.24 u[IU]/mL — ABNORMAL LOW (ref 0.35–4.50)

## 2019-07-10 LAB — HEMOGLOBIN A1C: Hgb A1c MFr Bld: 5.5 % (ref 4.6–6.5)

## 2019-07-13 ENCOUNTER — Encounter: Payer: Self-pay | Admitting: Family

## 2019-08-17 ENCOUNTER — Ambulatory Visit: Payer: Managed Care, Other (non HMO) | Admitting: Family

## 2019-08-17 DIAGNOSIS — Z0289 Encounter for other administrative examinations: Secondary | ICD-10-CM

## 2019-08-31 ENCOUNTER — Encounter: Payer: Self-pay | Admitting: Family

## 2019-09-02 ENCOUNTER — Other Ambulatory Visit: Payer: Self-pay

## 2019-09-02 DIAGNOSIS — I10 Essential (primary) hypertension: Secondary | ICD-10-CM

## 2019-09-02 MED ORDER — LOSARTAN POTASSIUM 50 MG PO TABS: 100.0000 mg | ORAL_TABLET | Freq: Every evening | ORAL | 1 refills | Status: DC

## 2019-09-02 NOTE — Telephone Encounter (Signed)
Called patient to give provider advice. Carlos Long agreed to discontinue the amlodipine and increase the losartan to 100mg  daily before bed. New medication dosage sent in to pharmacy and repeat labs scheduled for 09/11/19. Carlos Long verbalized understanding and had no further questions.

## 2019-09-10 ENCOUNTER — Telehealth: Payer: Self-pay | Admitting: *Deleted

## 2019-09-10 DIAGNOSIS — I10 Essential (primary) hypertension: Secondary | ICD-10-CM

## 2019-09-10 NOTE — Telephone Encounter (Signed)
Please place future orders for lab appt.  

## 2019-09-11 ENCOUNTER — Other Ambulatory Visit: Payer: Self-pay

## 2019-09-11 ENCOUNTER — Other Ambulatory Visit (INDEPENDENT_AMBULATORY_CARE_PROVIDER_SITE_OTHER): Payer: Managed Care, Other (non HMO)

## 2019-09-11 DIAGNOSIS — I10 Essential (primary) hypertension: Secondary | ICD-10-CM

## 2019-09-11 LAB — BASIC METABOLIC PANEL
BUN: 18 mg/dL (ref 6–23)
CO2: 28 mEq/L (ref 19–32)
Calcium: 9.4 mg/dL (ref 8.4–10.5)
Chloride: 106 mEq/L (ref 96–112)
Creatinine, Ser: 1.1 mg/dL (ref 0.40–1.50)
GFR: 73.59 mL/min (ref >60.00)
Glucose, Bld: 85 mg/dL (ref 70–99)
Potassium: 4.3 mEq/L (ref 3.5–5.1)
Sodium: 139 mEq/L (ref 135–145)

## 2019-09-12 ENCOUNTER — Other Ambulatory Visit: Payer: Self-pay | Admitting: Family

## 2019-10-20 ENCOUNTER — Ambulatory Visit: Payer: Managed Care, Other (non HMO) | Admitting: Family

## 2019-11-11 ENCOUNTER — Other Ambulatory Visit: Payer: Self-pay | Admitting: Family

## 2019-11-11 DIAGNOSIS — I159 Secondary hypertension, unspecified: Secondary | ICD-10-CM

## 2019-11-27 ENCOUNTER — Other Ambulatory Visit: Payer: Self-pay

## 2019-11-27 ENCOUNTER — Ambulatory Visit (INDEPENDENT_AMBULATORY_CARE_PROVIDER_SITE_OTHER): Payer: Managed Care, Other (non HMO) | Admitting: Family

## 2019-11-27 ENCOUNTER — Encounter: Payer: Self-pay | Admitting: Family

## 2019-11-27 DIAGNOSIS — F419 Anxiety disorder, unspecified: Secondary | ICD-10-CM | POA: Diagnosis not present

## 2019-11-27 DIAGNOSIS — I10 Essential (primary) hypertension: Secondary | ICD-10-CM

## 2019-11-27 DIAGNOSIS — J45909 Unspecified asthma, uncomplicated: Secondary | ICD-10-CM

## 2019-11-27 NOTE — Assessment & Plan Note (Signed)
Resolved. He will let me know how he is doing.

## 2019-11-27 NOTE — Assessment & Plan Note (Signed)
Stable, continue albuterol prn and advair 100-50 in the Fall when symptoms typically present.

## 2019-11-27 NOTE — Patient Instructions (Addendum)
I advise you to call Dr Tedd Sias, [phone], and check to see if you need to follow up with her for hyperthyroidism.  It is imperative that you are seen AT least twice per year for labs and monitoring. Monitor blood pressure at home and me 5-6 reading on separate days. Goal is less than 120/80, based on newest guidelines, however we certainly want to be less than 130/80;  if persistently higher, please make sooner follow up appointment so we can recheck you blood pressure and manage/ adjust medications.   Stay safe!

## 2019-11-27 NOTE — Progress Notes (Signed)
Subjective:    Patient ID: Carlos Long, male    DOB: 1980/09/10, 39 y.o.   MRN: [id number]  CC: [name] is a 39 y.o. male who presents today for follow up.   HPI: Anxiety has largely resolved. He feels he is doing much better He has good support with his family, pastor. Sleeping well. No si/hi  HTN- at home 130's to 141.Compliant with losartan 100mg .  No cp, sob, dizziness, syncope, HA. Endorses eating more processed foods lately.   Continues to walk about 6 miles per day and play either soccer or basketball everyday.   Asthma- controlled. hasnt had to use advair, albuterol since  Last fall.        HISTORY:  Past Medical History:  Diagnosis Date  . Asthma   . Frequent headaches   . Hypertension    Past Surgical History:  Procedure Laterality Date  . APPENDECTOMY     Family History  Problem Relation Age of Onset  . Diabetes Mother   . Arthritis Father   . Cancer Father        prostate  . Hyperlipidemia Father   . Hypertension Father   . Stroke Father 72  . Heart disease Father        CABG x 3  . Alcohol abuse Paternal Uncle   . Cancer Paternal Uncle        prostate  . Heart disease Paternal Grandmother   . Hypertension Paternal Grandmother   . Stroke Paternal Grandmother   . Pancreatic cancer Paternal Grandmother   . Alcohol abuse Paternal Grandfather   . Heart disease Paternal Grandfather   . Hypertension Paternal Grandfather   . Stroke Paternal Grandfather   . Colon cancer Neg Hx     Allergies: Amlodipine Current Outpatient Medications on File Prior to Visit  Medication Sig Dispense Refill  . losartan (COZAAR) 50 MG tablet Take 2 tablets (100 mg total) by mouth every evening. 90 tablet 1  . meloxicam (MOBIC) 7.5 MG tablet TAKE 1 TABLET BY MOUTH DAILY AS NEEDED FOR PAIN 30 tablet 1  . omeprazole (PRILOSEC) 20 MG capsule Take by mouth.    52 albuterol (PROVENTIL) (5 MG/ML) 0.5% nebulizer solution Take 0.5 mLs (2.5 mg total) by nebulization  every 6 (six) hours as needed for wheezing or shortness of breath. (Patient not taking: Reported on 07/03/2019) 20 mL 2  . Fluticasone-Salmeterol (ADVAIR DISKUS) 100-50 MCG/DOSE AEPB Inhale 1 puff into the lungs 2 (two) times daily. (Patient not taking: Reported on 11/27/2019) 60 each 3   No current facility-administered medications on file prior to visit.    Social History   Tobacco Use  . Smoking status: Never Smoker  . Smokeless tobacco: Never Used  Vaping Use  . Vaping Use: Never used  Substance Use Topics  . Alcohol use: Yes    Alcohol/week: 0.0 standard drinks  . Drug use: No    Review of Systems  Constitutional: Negative for chills and fever.  Respiratory: Negative for cough and shortness of breath.   Cardiovascular: Negative for chest pain and palpitations.  Gastrointestinal: Negative for nausea and vomiting.      Objective:    BP 132/86   Pulse 61   Temp 98.1 F (36.7 C)   Ht 5\' 11"  (1.803 m)   Wt 200 lb 9.6 oz (91 kg)   SpO2 98%   BMI 27.98 kg/m  BP Readings from Last 3 Encounters:  11/27/19 132/86  07/03/19 (!) 130/98  01/19/19 140/88  Wt Readings from Last 3 Encounters:  11/27/19 200 lb 9.6 oz (91 kg)  07/03/19 205 lb 6.4 oz (93.2 kg)  01/19/19 210 lb (95.3 kg)    Physical Exam Vitals reviewed.  Constitutional:      Appearance: He is well-developed.  Cardiovascular:     Rate and Rhythm: Regular rhythm.     Heart sounds: Normal heart sounds.  Pulmonary:     Effort: Pulmonary effort is normal. No respiratory distress.     Breath sounds: Normal breath sounds. No wheezing, rhonchi or rales.  Skin:    General: Skin is warm and dry.  Neurological:     Mental Status: He is alert.  Psychiatric:        Speech: Speech normal.        Behavior: Behavior normal.        Assessment & Plan:   Problem List Items Addressed This Visit      Cardiovascular and Mediastinum   HTN (hypertension)    Elevated today. Discussed goal < 120/80. He politely  declines adjuncting losartan 100mg  qd with hctz 12.5mg . He would like to work on diet, eating healthier and keep a bp log at home. Close follow up.        Respiratory   Asthma    Stable, continue albuterol prn and advair 100-50 in the Fall when symptoms typically present.         Other   Anxiety    Resolved. He will let me know how he is doing.          I have discontinued Carlos Long "Carlos Long"'s amLODipine. I am also having him maintain his omeprazole, Fluticasone-Salmeterol, albuterol, meloxicam, and losartan.   No orders of the defined types were placed in this encounter.   Return precautions given.   Risks, benefits, and alternatives of the medications and treatment plan prescribed today were discussed, and patient expressed understanding.   Education regarding symptom management and diagnosis given to patient on AVS.  Continue to follow with Carlos Nottingham, FNP for routine health maintenance.   Allegra Grana and I agreed with plan.   Carlos Nottingham, FNP

## 2019-11-27 NOTE — Assessment & Plan Note (Addendum)
Elevated today. Discussed goal < 120/80. He politely declines adjuncting losartan 100mg  qd with hctz 12.5mg . He would like to work on diet, eating healthier and keep a bp log at home. Close follow up.

## 2019-12-07 ENCOUNTER — Other Ambulatory Visit: Payer: Self-pay | Admitting: Family

## 2019-12-07 DIAGNOSIS — I10 Essential (primary) hypertension: Secondary | ICD-10-CM

## 2020-02-29 ENCOUNTER — Ambulatory Visit: Payer: Managed Care, Other (non HMO) | Admitting: Family

## 2020-03-14 ENCOUNTER — Other Ambulatory Visit: Payer: Self-pay

## 2020-03-14 ENCOUNTER — Ambulatory Visit (INDEPENDENT_AMBULATORY_CARE_PROVIDER_SITE_OTHER): Payer: Managed Care, Other (non HMO) | Admitting: Family

## 2020-03-14 ENCOUNTER — Encounter: Payer: Self-pay | Admitting: Family

## 2020-03-14 VITALS — BP 142/84 | HR 76 | Temp 98.2°F | Ht 71.0 in | Wt 210.2 lb

## 2020-03-14 DIAGNOSIS — I10 Essential (primary) hypertension: Secondary | ICD-10-CM | POA: Diagnosis not present

## 2020-03-14 MED ORDER — HYDROCHLOROTHIAZIDE 12.5 MG PO CAPS: 12.5000 mg | ORAL_CAPSULE | Freq: Every day | ORAL | 1 refills | Status: DC

## 2020-03-14 NOTE — Assessment & Plan Note (Signed)
Uncontrolled. Continue losartan 100mg . Start hctz 12.5mg . labs in one week and again in 6 weeks at follow up.

## 2020-03-14 NOTE — Patient Instructions (Signed)
Continue lostartan Start hctz 12.5mg  Labs in one week  It is imperative that you are seen AT least twice per year for labs and monitoring. However we will monitor labs in one week, 6 weeks ( follow up) and then 3 month prior to getting to every 6 months.   Monitor blood pressure at home and me 5-6 reading on separate days. Goal is less than 120/80, based on newest guidelines, however we certainly want to be less than 130/80;  if persistently higher, please make sooner follow up appointment so we can recheck you blood pressure and manage/ adjust medications.   Managing Your Hypertension Hypertension, also called high blood pressure, is when the force of the blood pressing against the walls of the arteries is too strong. Arteries are blood vessels that carry blood from your heart throughout your body. Hypertension forces the heart to work harder to pump blood and may cause the arteries to become narrow or stiff. Understanding blood pressure readings Your personal target blood pressure may vary depending on your medical conditions, your age, and other factors. A blood pressure reading includes a higher number over a lower number. Ideally, your blood pressure should be below 120/80. You should know that:  The first, or top, number is called the systolic pressure. It is a measure of the pressure in your arteries as your heart beats.  The second, or bottom number, is called the diastolic pressure. It is a measure of the pressure in your arteries as the heart relaxes. Blood pressure is classified into four stages. Based on your blood pressure reading, your health care provider may use the following stages to determine what type of treatment you need, if any. Systolic pressure and diastolic pressure are measured in a unit called mmHg. Normal  Systolic pressure: below 120.  Diastolic pressure: below 80. Elevated  Systolic pressure: 120-129.  Diastolic pressure: below 80. Hypertension stage  1  Systolic pressure: 130-139.  Diastolic pressure: 80-89. Hypertension stage 2  Systolic pressure: 140 or above.  Diastolic pressure: 90 or above. How can this condition affect me? Managing your hypertension is an important responsibility. Over time, hypertension can damage the arteries and decrease blood flow to important parts of the body, including the brain, heart, and kidneys. Having untreated or uncontrolled hypertension can lead to:  A heart attack.  A stroke.  A weakened blood vessel (aneurysm).  Heart failure.  Kidney damage.  Eye damage.  Metabolic syndrome.  Memory and concentration problems.  Vascular dementia. What actions can I take to manage this condition? Hypertension can be managed by making lifestyle changes and possibly by taking medicines. Your health care provider will help you make a plan to bring your blood pressure within a normal range. Nutrition  Eat a diet that is high in fiber and potassium, and low in salt (sodium), added sugar, and fat. An example eating plan is called the Dietary Approaches to Stop Hypertension (DASH) diet. To eat this way: ? Eat plenty of fresh fruits and vegetables. Try to fill one-half of your plate at each meal with fruits and vegetables. ? Eat whole grains, such as whole-wheat pasta, brown rice, or whole-grain bread. Fill about one-fourth of your plate with whole grains. ? Eat low-fat dairy products. ? Avoid fatty cuts of meat, processed or cured meats, and poultry with skin. Fill about one-fourth of your plate with lean proteins such as fish, chicken without skin, beans, eggs, and tofu. ? Avoid pre-made and processed foods. These tend to be  higher in sodium, added sugar, and fat.  Reduce your daily sodium intake. Most people with hypertension should eat less than 1,500 mg of sodium a day.   Lifestyle  Work with your health care provider to maintain a healthy body weight or to lose weight. Ask what an ideal weight is  for you.  Get at least 30 minutes of exercise that causes your heart to beat faster (aerobic exercise) most days of the week. Activities may include walking, swimming, or biking.  Include exercise to strengthen your muscles (resistance exercise), such as weight lifting, as part of your weekly exercise routine. Try to do these types of exercises for 30 minutes at least 3 days a week.  Do not use any products that contain nicotine or tobacco, such as cigarettes, e-cigarettes, and chewing tobacco. If you need help quitting, ask your health care provider.  Control any long-term (chronic) conditions you have, such as high cholesterol or diabetes.  Identify your sources of stress and find ways to manage stress. This may include meditation, deep breathing, or making time for fun activities.   Alcohol use  Do not drink alcohol if: ? Your health care provider tells you not to drink. ? You are pregnant, may be pregnant, or are planning to become pregnant.  If you drink alcohol: ? Limit how much you use to:  0-1 drink a day for women.  0-2 drinks a day for men. ? Be aware of how much alcohol is in your drink. In the [place] one drink equals one 12 oz bottle of beer (355 mL), one 5 oz glass of wine (148 mL), or one 1 oz glass of hard liquor (44 mL). Medicines Your health care provider may prescribe medicine if lifestyle changes are not enough to get your blood pressure under control and if:  Your systolic blood pressure is 130 or higher.  Your diastolic blood pressure is 80 or higher. Take medicines only as told by your health care provider. Follow the directions carefully. Blood pressure medicines must be taken as told by your health care provider. The medicine does not work as well when you skip doses. Skipping doses also puts you at risk for problems. Monitoring Before you monitor your blood pressure:  Do not smoke, drink caffeinated beverages, or exercise within 30 minutes before taking a  measurement.  Use the bathroom and empty your bladder (urinate).  Sit quietly for at least 5 minutes before taking measurements. Monitor your blood pressure at home as told by your health care provider. To do this:  Sit with your back straight and supported.  Place your feet flat on the floor. Do not cross your legs.  Support your arm on a flat surface, such as a table. Make sure your upper arm is at heart level.  Each time you measure, take two or three readings one minute apart and record the results. You may also need to have your blood pressure checked regularly by your health care provider.   General information  Talk with your health care provider about your diet, exercise habits, and other lifestyle factors that may be contributing to hypertension.  Review all the medicines you take with your health care provider because there may be side effects or interactions.  Keep all visits as told by your health care provider. Your health care provider can help you create and adjust your plan for managing your high blood pressure. Where to find more information  National Heart, Lung, and Blood Institute: PopSteam.is  American Heart Association: www.heart.org Contact a health care provider if:  You think you are having a reaction to medicines you have taken.  You have repeated (recurrent) headaches.  You feel dizzy.  You have swelling in your ankles.  You have trouble with your vision. Get help right away if:  You develop a severe headache or confusion.  You have unusual weakness or numbness, or you feel faint.  You have severe pain in your chest or abdomen.  You vomit repeatedly.  You have trouble breathing. These symptoms may represent a serious problem that is an emergency. Do not wait to see if the symptoms will go away. Get medical help right away. Call your local emergency services (911 in the [place] Do not drive yourself to the  hospital. Summary  Hypertension is when the force of blood pumping through your arteries is too strong. If this condition is not controlled, it may put you at risk for serious complications.  Your personal target blood pressure may vary depending on your medical conditions, your age, and other factors. For most people, a normal blood pressure is less than 120/80.  Hypertension is managed by lifestyle changes, medicines, or both.  Lifestyle changes to help manage hypertension include losing weight, eating a healthy, low-sodium diet, exercising more, stopping smoking, and limiting alcohol. This information is not intended to replace advice given to you by your health care provider. Make sure you discuss any questions you have with your health care provider. Document Revised: 03/13/2019 Document Reviewed: 01/06/2019 Elsevier Patient Education  2021 [employer].

## 2020-03-14 NOTE — Progress Notes (Signed)
Subjective:    Patient ID: Carlos Long, male    DOB: 02/24/1980, 40 y.o.   MRN: [id number]  CC: Carlos Long is a 40 y.o. male who presents today for follow up.   HPI:  Feels well today No complaints Work is going well. Continues to confide in pastor which is helpful for him.  SBP at home 138. Doesn't recall DBP. No cp, sob.  Compliant with losartan 100mg .     HISTORY:  Past Medical History:  Diagnosis Date  . Asthma   . Frequent headaches   . Hypertension    Past Surgical History:  Procedure Laterality Date  . APPENDECTOMY     Family History  Problem Relation Age of Onset  . Diabetes Mother   . Arthritis Father   . Cancer Father        prostate  . Hyperlipidemia Father   . Hypertension Father   . Stroke Father 51  . Heart disease Father        CABG x 3  . Alcohol abuse Paternal Uncle   . Cancer Paternal Uncle        prostate  . Heart disease Paternal Grandmother   . Hypertension Paternal Grandmother   . Stroke Paternal Grandmother   . Pancreatic cancer Paternal Grandmother   . Alcohol abuse Paternal Grandfather   . Heart disease Paternal Grandfather   . Hypertension Paternal Grandfather   . Stroke Paternal Grandfather   . Colon cancer Neg Hx     Allergies: Amlodipine Current Outpatient Medications on File Prior to Visit  Medication Sig Dispense Refill  . losartan (COZAAR) 50 MG tablet TAKE 2 TABLETS (100 MG TOTAL) BY MOUTH EVERY EVENING. 180 tablet 1  . omeprazole (PRILOSEC) 20 MG capsule Take by mouth.    52 albuterol (PROVENTIL) (5 MG/ML) 0.5% nebulizer solution Take 0.5 mLs (2.5 mg total) by nebulization every 6 (six) hours as needed for wheezing or shortness of breath. (Patient not taking: No sig reported) 20 mL 2  . Fluticasone-Salmeterol (ADVAIR DISKUS) 100-50 MCG/DOSE AEPB Inhale 1 puff into the lungs 2 (two) times daily. (Patient not taking: No sig reported) 60 each 3   No current facility-administered medications on file prior to visit.     Social History   Tobacco Use  . Smoking status: Never Smoker  . Smokeless tobacco: Never Used  Vaping Use  . Vaping Use: Never used  Substance Use Topics  . Alcohol use: Yes    Alcohol/week: 0.0 standard drinks  . Drug use: No    Review of Systems  Constitutional: Negative for chills and fever.  HENT: Negative for congestion, ear pain, rhinorrhea, sinus pressure and sore throat.   Respiratory: Negative for cough, shortness of breath and wheezing.   Cardiovascular: Negative for chest pain, palpitations and leg swelling.  Gastrointestinal: Negative for diarrhea, nausea and vomiting.  Genitourinary: Negative for dysuria.  Musculoskeletal: Negative for myalgias.  Skin: Negative for rash.  Neurological: Negative for headaches.  Hematological: Negative for adenopathy.      Objective:    BP (!) 142/84   Pulse 76   Temp 98.2 F (36.8 C)   Ht 5\' 11"  (1.803 m)   Wt 210 lb 3.2 oz (95.3 kg)   SpO2 97%   BMI 29.32 kg/m  BP Readings from Last 3 Encounters:  03/14/20 (!) 142/84  11/27/19 132/86  07/03/19 (!) 130/98   Wt Readings from Last 3 Encounters:  03/14/20 210 lb 3.2 oz (95.3 kg)  11/27/19 200 lb  9.6 oz (91 kg)  07/03/19 205 lb 6.4 oz (93.2 kg)    Physical Exam Vitals reviewed.  Constitutional:      Appearance: He is well-developed and well-nourished.  Cardiovascular:     Rate and Rhythm: Regular rhythm.     Heart sounds: Normal heart sounds.  Pulmonary:     Effort: Pulmonary effort is normal. No respiratory distress.     Breath sounds: Normal breath sounds. No wheezing, rhonchi or rales.  Skin:    General: Skin is warm and dry.  Neurological:     Mental Status: He is alert.  Psychiatric:        Mood and Affect: Mood and affect normal.        Speech: Speech normal.        Behavior: Behavior normal.        Assessment & Plan:   Problem List Items Addressed This Visit      Cardiovascular and Mediastinum   HTN (hypertension) - Primary     Uncontrolled. Continue losartan 100mg . Start hctz 12.5mg . labs in one week and again in 6 weeks at follow up.       Relevant Medications   hydrochlorothiazide (MICROZIDE) 12.5 MG capsule   Other Relevant Orders   Basic metabolic panel       I have discontinued Zaide Kardell "Carlos Long"'s meloxicam. I am also having him start on hydrochlorothiazide. Additionally, I am having him maintain his omeprazole, Fluticasone-Salmeterol, albuterol, and losartan.   Meds ordered this encounter  Medications  . hydrochlorothiazide (MICROZIDE) 12.5 MG capsule    Sig: Take 1 capsule (12.5 mg total) by mouth daily.    Dispense:  90 capsule    Refill:  1    Order Specific Question:   Supervising Provider    Answer:   [name] [[id number]]    Return precautions given.   Risks, benefits, and alternatives of the medications and treatment plan prescribed today were discussed, and patient expressed understanding.   Education regarding symptom management and diagnosis given to patient on AVS.  Continue to follow with Sherlene Shams, FNP for routine health maintenance.   Allegra Grana and I agreed with plan.   [name], FNP

## 2020-03-21 ENCOUNTER — Other Ambulatory Visit: Payer: Managed Care, Other (non HMO)

## 2020-03-21 ENCOUNTER — Encounter: Payer: Self-pay | Admitting: Family

## 2020-05-04 ENCOUNTER — Ambulatory Visit: Payer: Managed Care, Other (non HMO) | Admitting: Family

## 2020-05-04 DIAGNOSIS — Z0289 Encounter for other administrative examinations: Secondary | ICD-10-CM

## 2020-05-30 ENCOUNTER — Other Ambulatory Visit: Payer: Self-pay

## 2020-05-30 ENCOUNTER — Other Ambulatory Visit: Payer: Self-pay | Admitting: Family

## 2020-05-30 ENCOUNTER — Encounter: Payer: Self-pay | Admitting: Family

## 2020-05-30 DIAGNOSIS — J45909 Unspecified asthma, uncomplicated: Secondary | ICD-10-CM

## 2020-05-30 DIAGNOSIS — J4520 Mild intermittent asthma, uncomplicated: Secondary | ICD-10-CM

## 2020-05-30 MED ORDER — ALBUTEROL SULFATE HFA 108 (90 BASE) MCG/ACT IN AERS: 2.0000 | INHALATION_SPRAY | Freq: Four times a day (QID) | RESPIRATORY_TRACT | 2 refills | Status: DC | PRN

## 2020-05-30 MED ORDER — ALBUTEROL SULFATE (5 MG/ML) 0.5% IN NEBU: 2.5000 mg | INHALATION_SOLUTION | Freq: Four times a day (QID) | RESPIRATORY_TRACT | 2 refills | Status: DC | PRN

## 2020-06-08 ENCOUNTER — Other Ambulatory Visit: Payer: Self-pay | Admitting: Family

## 2020-06-08 DIAGNOSIS — I10 Essential (primary) hypertension: Secondary | ICD-10-CM

## 2020-09-09 ENCOUNTER — Other Ambulatory Visit: Payer: Self-pay | Admitting: Family

## 2020-09-09 DIAGNOSIS — I10 Essential (primary) hypertension: Secondary | ICD-10-CM

## 2020-09-12 ENCOUNTER — Telehealth: Payer: Self-pay | Admitting: Family

## 2020-09-12 NOTE — Telephone Encounter (Signed)
Call patient He is overdue for in person follow-up with me.  Particular with blood pressure medications, he needs in person visit as well as labs.  Please schedule

## 2020-09-13 NOTE — Telephone Encounter (Signed)
Patient scheduled for f/u 10/03/20.

## 2020-10-03 ENCOUNTER — Other Ambulatory Visit: Payer: Self-pay

## 2020-10-03 ENCOUNTER — Ambulatory Visit (INDEPENDENT_AMBULATORY_CARE_PROVIDER_SITE_OTHER): Payer: Managed Care, Other (non HMO) | Admitting: Family

## 2020-10-03 ENCOUNTER — Encounter: Payer: Self-pay | Admitting: Family

## 2020-10-03 DIAGNOSIS — J4520 Mild intermittent asthma, uncomplicated: Secondary | ICD-10-CM

## 2020-10-03 DIAGNOSIS — I10 Essential (primary) hypertension: Secondary | ICD-10-CM

## 2020-10-03 NOTE — Patient Instructions (Signed)
Since she has resumed hydrochlorothiazide 12.5 mg, it may take up to 7 days for blood pressure to reach goal.  It is imperative that you are seen AT least twice per year for labs and monitoring. Monitor blood pressure at home and me 5-6 reading on separate days. Goal is less than 120/80, based on newest guidelines, however we certainly want to be less than 130/80;  if persistently higher, please make sooner follow up appointment so we can recheck you blood pressure and manage/ adjust medications.  Please ensure you do schedule your physical so we may do labs as soon as you can

## 2020-10-03 NOTE — Assessment & Plan Note (Signed)
Slightly elevated today however he has been off of hydrochlorothiazide.  We agreed not to make any changes to blood pressure regimen today.  If  blood pressure does not approach normal range less than 120/80, patient will call me let me know.  Continue losartan 100mg , hctz 12.5mg 

## 2020-10-03 NOTE — Assessment & Plan Note (Signed)
Excellent control.  Continue as needed use of albuterol inhaler.

## 2020-10-03 NOTE — Progress Notes (Signed)
Subjective:    Patient ID: Carlos Long, male    DOB: [birth year], 40 y.o.   MRN: [id number]  CC: Carlos Long is a 40 y.o. male who presents today for follow up.   HPI: Feels well today No complaints  Asthma - well controlled. Hasnt needed advair. He uses albuterol inhaler prn with relief.   No cough, wheezing.   HTN- compliant with losartan 100mg , hctz 12.5mg  He was off of hctz 12.5mg  for 3-4 weeks due to camp with children and not being able to urinate as frequently. He has resumed hctz 3 days ago.    HISTORY:  Past Medical History:  Diagnosis Date   Asthma    Frequent headaches    Hypertension    Past Surgical History:  Procedure Laterality Date   APPENDECTOMY     Family History  Problem Relation Age of Onset   Diabetes Mother    Arthritis Father    Cancer Father        prostate   Hyperlipidemia Father    Hypertension Father    Stroke Father 20   Heart disease Father        CABG x 3   Alcohol abuse Paternal Uncle    Cancer Paternal Uncle        prostate   Heart disease Paternal Grandmother    Hypertension Paternal Grandmother    Stroke Paternal Grandmother    Pancreatic cancer Paternal Grandmother    Alcohol abuse Paternal Grandfather    Heart disease Paternal Grandfather    Hypertension Paternal Grandfather    Stroke Paternal Grandfather    Colon cancer Neg Hx     Allergies: Amlodipine Current Outpatient Medications on File Prior to Visit  Medication Sig Dispense Refill   albuterol (PROVENTIL) (5 MG/ML) 0.5% nebulizer solution Take 0.5 mLs (2.5 mg total) by nebulization every 6 (six) hours as needed for wheezing or shortness of breath. 20 mL 2   albuterol (VENTOLIN HFA) 108 (90 Base) MCG/ACT inhaler Inhale 2 puffs into the lungs every 6 (six) hours as needed for wheezing or shortness of breath. 8 g 2   hydrochlorothiazide (MICROZIDE) 12.5 MG capsule TAKE 1 CAPSULE BY MOUTH EVERY DAY 90 capsule 1   losartan (COZAAR) 50 MG tablet TAKE 2 TABLETS  (100 MG TOTAL) BY MOUTH EVERY EVENING. 180 tablet 1   omeprazole (PRILOSEC) 20 MG capsule Take by mouth.     Fluticasone-Salmeterol (ADVAIR DISKUS) 100-50 MCG/DOSE AEPB Inhale 1 puff into the lungs 2 (two) times daily. (Patient not taking: No sig reported) 60 each 3   No current facility-administered medications on file prior to visit.    Social History   Tobacco Use   Smoking status: Never   Smokeless tobacco: Never  Vaping Use   Vaping Use: Never used  Substance Use Topics   Alcohol use: Yes    Alcohol/week: 0.0 standard drinks   Drug use: No    Review of Systems  Constitutional:  Negative for chills and fever.  Respiratory:  Negative for cough.   Cardiovascular:  Negative for chest pain and palpitations.  Gastrointestinal:  Negative for nausea and vomiting.     Objective:    BP (!) 124/92 (BP Location: Left Arm, Patient Position: Sitting, Cuff Size: Large)   Pulse 72   Temp 98.2 F (36.8 C) (Oral)   Ht 5\' 11"  (1.803 m)   Wt 210 lb 3.2 oz (95.3 kg)   SpO2 97%   BMI 29.32 kg/m  BP Readings from Last  3 Encounters:  10/03/20 (!) 124/92  03/14/20 (!) 142/84  11/27/19 132/86   Wt Readings from Last 3 Encounters:  10/03/20 210 lb 3.2 oz (95.3 kg)  03/14/20 210 lb 3.2 oz (95.3 kg)  11/27/19 200 lb 9.6 oz (91 kg)    Physical Exam Vitals reviewed.  Constitutional:      Appearance: He is well-developed.  Cardiovascular:     Rate and Rhythm: Regular rhythm.     Heart sounds: Normal heart sounds.  Pulmonary:     Effort: Pulmonary effort is normal. No respiratory distress.     Breath sounds: Normal breath sounds. No wheezing, rhonchi or rales.  Skin:    General: Skin is warm and dry.  Neurological:     Mental Status: He is alert.  Psychiatric:        Speech: Speech normal.        Behavior: Behavior normal.       Assessment & Plan:   Problem List Items Addressed This Visit       Cardiovascular and Mediastinum   HTN (hypertension)    Slightly elevated  today however he has been off of hydrochlorothiazide.  We agreed not to make any changes to blood pressure regimen today.  If  blood pressure does not approach normal range less than 120/80, patient will call me let me know.  Continue losartan 100mg , hctz 12.5mg         Respiratory   Mild intermittent asthma without complication    Excellent control.  Continue as needed use of albuterol inhaler.        I am having "Carlos Long" maintain his omeprazole, Fluticasone-Salmeterol, albuterol, albuterol, losartan, and hydrochlorothiazide.   No orders of the defined types were placed in this encounter.   Return precautions given.   Risks, benefits, and alternatives of the medications and treatment plan prescribed today were discussed, and patient expressed understanding.   Education regarding symptom management and diagnosis given to patient on AVS.  Continue to follow with [name], FNP for routine health maintenance.   Allegra Grana and I agreed with plan.   [name], FNP

## 2020-10-31 ENCOUNTER — Encounter: Payer: Self-pay | Admitting: Family

## 2020-12-27 ENCOUNTER — Other Ambulatory Visit: Payer: Self-pay | Admitting: Family

## 2020-12-27 DIAGNOSIS — I10 Essential (primary) hypertension: Secondary | ICD-10-CM

## 2021-01-11 ENCOUNTER — Encounter: Payer: Self-pay | Admitting: Family

## 2021-01-11 ENCOUNTER — Telehealth: Payer: Self-pay

## 2021-01-11 NOTE — Telephone Encounter (Signed)
Patient has been notified of provider recommendations. No questions, comments, nor concerns.

## 2021-03-20 ENCOUNTER — Ambulatory Visit (INDEPENDENT_AMBULATORY_CARE_PROVIDER_SITE_OTHER): Payer: Managed Care, Other (non HMO) | Admitting: Family

## 2021-03-20 ENCOUNTER — Encounter: Payer: Self-pay | Admitting: Family

## 2021-03-20 ENCOUNTER — Other Ambulatory Visit: Payer: Self-pay

## 2021-03-20 VITALS — BP 122/72 | HR 57 | Temp 98.1°F | Ht 71.0 in | Wt 198.8 lb

## 2021-03-20 DIAGNOSIS — Z0000 Encounter for general adult medical examination without abnormal findings: Secondary | ICD-10-CM | POA: Diagnosis not present

## 2021-03-20 DIAGNOSIS — I10 Essential (primary) hypertension: Secondary | ICD-10-CM | POA: Diagnosis not present

## 2021-03-20 DIAGNOSIS — R5383 Other fatigue: Secondary | ICD-10-CM

## 2021-03-20 NOTE — Assessment & Plan Note (Signed)
Excellent control.  Suspect weight loss has contributed to this.  For now we agreed to continue losartan 50 mg, hydrochlorothiazide 12.5 mg.  However have advised him to be very vigilant and monitor blood pressure at home and certainly for symptoms such as dizziness.  In the setting of continued weight loss, likely we will need to stop 1 antihypertensive

## 2021-03-20 NOTE — Assessment & Plan Note (Signed)
Included PSAin  screening due to patient preference and family history of prostate cancer.  No family history colon cancer or changes in bowel habits, defer colonoscopy until he is 41 years of age.  Encouraged continued exercise.  Congratulated patient on weight loss.

## 2021-03-20 NOTE — Patient Instructions (Signed)
Please let me know how you are doing and certainly if fatigue worsens or persists   nice to see you!  Health Maintenance, Male Adopting a healthy lifestyle and getting preventive care are important in promoting health and wellness. Ask your health care provider about: The right schedule for you to have regular tests and exams. Things you can do on your own to prevent diseases and keep yourself healthy. What should I know about diet, weight, and exercise? Eat a healthy diet  Eat a diet that includes plenty of vegetables, fruits, low-fat dairy products, and lean protein. Do not eat a lot of foods that are high in solid fats, added sugars, or sodium. Maintain a healthy weight Body mass index (BMI) is a measurement that can be used to identify possible weight problems. It estimates body fat based on height and weight. Your health care provider can help determine your BMI and help you achieve or maintain a healthy weight. Get regular exercise Get regular exercise. This is one of the most important things you can do for your health. Most adults should: Exercise for at least 150 minutes each week. The exercise should increase your heart rate and make you sweat (moderate-intensity exercise). Do strengthening exercises at least twice a week. This is in addition to the moderate-intensity exercise. Spend less time sitting. Even light physical activity can be beneficial. Watch cholesterol and blood lipids Have your blood tested for lipids and cholesterol at 43 years of age, then have this test every 5 years. You may need to have your cholesterol levels checked more often if: Your lipid or cholesterol levels are high. You are older than 43 years of age. You are at high risk for heart disease. What should I know about cancer screening? Many types of cancers can be detected early and may often be prevented. Depending on your health history and family history, you may need to have cancer screening at  various ages. This may include screening for: Colorectal cancer. Prostate cancer. Skin cancer. Lung cancer. What should I know about heart disease, diabetes, and high blood pressure? Blood pressure and heart disease High blood pressure causes heart disease and increases the risk of stroke. This is more likely to develop in people who have high blood pressure readings or are overweight. Talk with your health care provider about your target blood pressure readings. Have your blood pressure checked: Every 3-5 years if you are 30-69 years of age. Every year if you are 31 years old or older. If you are between the ages of 3 and 46 and are a current or former smoker, ask your health care provider if you should have a one-time screening for abdominal aortic aneurysm (AAA). Diabetes Have regular diabetes screenings. This checks your fasting blood sugar level. Have the screening done: Once every three years after age 57 if you are at a normal weight and have a low risk for diabetes. More often and at a younger age if you are overweight or have a high risk for diabetes. What should I know about preventing infection? Hepatitis B If you have a higher risk for hepatitis B, you should be screened for this virus. Talk with your health care provider to find out if you are at risk for hepatitis B infection. Hepatitis C Blood testing is recommended for: Everyone born from [birth year] through 1967. Anyone with known risk factors for hepatitis C. Sexually transmitted infections (STIs) You should be screened each year for STIs, including gonorrhea and chlamydia, if:  You are sexually active and are younger than 43 years of age. You are older than 43 years of age and your health care provider tells you that you are at risk for this type of infection. Your sexual activity has changed since you were last screened, and you are at increased risk for chlamydia or gonorrhea. Ask your health care provider if you are at  risk. Ask your health care provider about whether you are at high risk for HIV. Your health care provider may recommend a prescription medicine to help prevent HIV infection. If you choose to take medicine to prevent HIV, you should first get tested for HIV. You should then be tested every 3 months for as long as you are taking the medicine. Follow these instructions at home: Alcohol use Do not drink alcohol if your health care provider tells you not to drink. If you drink alcohol: Limit how much you have to 0-2 drinks a day. Know how much alcohol is in your drink. In the [place] one drink equals one 12 oz bottle of beer (355 mL), one 5 oz glass of wine (148 mL), or one 1 oz glass of hard liquor (44 mL). Lifestyle Do not use any products that contain nicotine or tobacco. These products include cigarettes, chewing tobacco, and vaping devices, such as e-cigarettes. If you need help quitting, ask your health care provider. Do not use street drugs. Do not share needles. Ask your health care provider for help if you need support or information about quitting drugs. General instructions Schedule regular health, dental, and eye exams. Stay current with your vaccines. Tell your health care provider if: You often feel depressed. You have ever been abused or do not feel safe at home. Summary Adopting a healthy lifestyle and getting preventive care are important in promoting health and wellness. Follow your health care provider's instructions about healthy diet, exercising, and getting tested or screened for diseases. Follow your health care provider's instructions on monitoring your cholesterol and blood pressure. This information is not intended to replace advice given to you by your health care provider. Make sure you discuss any questions you have with your health care provider. Document Revised: 06/27/2020 Document Reviewed: 06/27/2020 Elsevier Patient Education  2022 [employer].

## 2021-03-20 NOTE — Assessment & Plan Note (Signed)
Complains of fatigue. No known etiology at this time.  I suspect his sleep is adequate as 7 hours /night has been his normal for years.   Discussed whether CPAP would need to be titrated.  Pending thorough lab evaluation for metabolic, nutritional etiology.  Advised 6-week follow-up to discuss fatigue, further eval

## 2021-03-20 NOTE — Progress Notes (Signed)
Subjective:    Patient ID: Carlos Long, male    DOB: [birth year], 41 y.o.   MRN: [id number]  CC: Carlos Long is a 41 y.o. male who presents today for physical exam.    HPI: Complains of increased fatigue for past several weeks.   Goes to bed at  930/10 and gets up 445. 7 hours of sleep is adequate for him and his body wakes up naturally. Sleep is restorative. He will have episodic periods of fatigue at different times of day.  Exercise helps with energy.   No fever, chills, unusual bone pain, cough.   He has lost 12 lbs intentionally, eating healthier with small frequent meals.  Working out 5 days per week  HTN- compliant with losartan 50 mg, hydrochlorothiazide 12.5 mg.  OSA- compliant with cipap. No HA.   Colorectal  Cancer Screening: No family history colon cancer.  No abdominal pain or changes to bowel habits.  Prostate Cancer Screening: Father had prostate cancer.  No urinary hesitancy, decreased urine flow.   Lung Cancer Screening: No 30 year pack year history and > 50 years to 80 years.    Immunizations       Tetanus - UTD     Labs: Screening labs today. Exercise: Gets regular exercise.   Alcohol use:  none Smoking/tobacco use: Nonsmoker.     HISTORY:  Past Medical History:  Diagnosis Date   Asthma    Frequent headaches    Hypertension     Past Surgical History:  Procedure Laterality Date   APPENDECTOMY     Family History  Problem Relation Age of Onset   Diabetes Mother    Arthritis Father    Cancer Father        prostate   Hyperlipidemia Father    Hypertension Father    Stroke Father 49   Heart disease Father        CABG x 3   Alcohol abuse Paternal Uncle    Cancer Paternal Uncle        prostate   Heart disease Paternal Grandmother    Hypertension Paternal Grandmother    Stroke Paternal Grandmother    Pancreatic cancer Paternal Grandmother    Alcohol abuse Paternal Grandfather    Heart disease Paternal Grandfather    Hypertension  Paternal Grandfather    Stroke Paternal Grandfather    Colon cancer Neg Hx       ALLERGIES: Amlodipine  Current Outpatient Medications on File Prior to Visit  Medication Sig Dispense Refill   albuterol (PROVENTIL) (5 MG/ML) 0.5% nebulizer solution Take 0.5 mLs (2.5 mg total) by nebulization every 6 (six) hours as needed for wheezing or shortness of breath. 20 mL 2   albuterol (VENTOLIN HFA) 108 (90 Base) MCG/ACT inhaler Inhale 2 puffs into the lungs every 6 (six) hours as needed for wheezing or shortness of breath. 8 g 2   hydrochlorothiazide (MICROZIDE) 12.5 MG capsule TAKE 1 CAPSULE BY MOUTH EVERY DAY 90 capsule 1   losartan (COZAAR) 50 MG tablet TAKE 2 TABLETS (100 MG TOTAL) BY MOUTH EVERY EVENING. 180 tablet 1   Fluticasone-Salmeterol (ADVAIR DISKUS) 100-50 MCG/DOSE AEPB Inhale 1 puff into the lungs 2 (two) times daily. (Patient not taking: Reported on 03/20/2021) 60 each 3   omeprazole (PRILOSEC) 20 MG capsule Take by mouth.     No current facility-administered medications on file prior to visit.    Social History   Tobacco Use   Smoking status: Never   Smokeless tobacco: Never  Vaping Use   Vaping Use: Never used  Substance Use Topics   Alcohol use: Yes    Alcohol/week: 0.0 standard drinks   Drug use: No    Review of Systems  Constitutional:  Negative for chills and fever.  HENT:  Negative for congestion, ear pain, rhinorrhea, sinus pressure and sore throat.   Respiratory:  Negative for cough, shortness of breath and wheezing.   Cardiovascular:  Negative for chest pain, palpitations and leg swelling.  Gastrointestinal:  Negative for diarrhea, nausea and vomiting.  Genitourinary:  Negative for decreased urine volume, difficulty urinating and dysuria.  Musculoskeletal:  Negative for myalgias.  Skin:  Negative for rash.  Neurological:  Negative for headaches.  Hematological:  Negative for adenopathy.  Psychiatric/Behavioral:  Negative for confusion.      Objective:     BP 122/72 (BP Location: Left Arm, Patient Position: Sitting, Cuff Size: Large)    Pulse (!) 57    Temp 98.1 F (36.7 C) (Oral)    Ht 5\' 11"  (1.803 m)    Wt 198 lb 12.8 oz (90.2 kg)    SpO2 98%    BMI 27.73 kg/m   BP Readings from Last 3 Encounters:  03/20/21 122/72  10/03/20 (!) 124/92  03/14/20 (!) 142/84   Wt Readings from Last 3 Encounters:  03/20/21 198 lb 12.8 oz (90.2 kg)  10/03/20 210 lb 3.2 oz (95.3 kg)  03/14/20 210 lb 3.2 oz (95.3 kg)    Physical Exam Vitals reviewed.  Constitutional:      Appearance: Normal appearance. He is well-developed.  Neck:     Thyroid: No thyroid mass or thyromegaly.  Cardiovascular:     Rate and Rhythm: Regular rhythm.     Heart sounds: Normal heart sounds.  Pulmonary:     Effort: Pulmonary effort is normal. No respiratory distress.     Breath sounds: Normal breath sounds. No wheezing, rhonchi or rales.  Abdominal:     General: Bowel sounds are normal. There is no distension.     Palpations: Abdomen is soft. Abdomen is not rigid. There is no fluid wave or mass.     Tenderness: There is no abdominal tenderness. There is no guarding or rebound. Negative signs include Murphy's sign and McBurney's sign.  Lymphadenopathy:     Head:     Right side of head: No submental, submandibular, tonsillar, preauricular, posterior auricular or occipital adenopathy.     Left side of head: No submental, submandibular, tonsillar, preauricular, posterior auricular or occipital adenopathy.     Cervical: No cervical adenopathy.  Skin:    General: Skin is warm and dry.  Neurological:     Mental Status: He is alert.  Psychiatric:        Speech: Speech normal.        Behavior: Behavior normal.       Assessment & Plan:   Problem List Items Addressed This Visit       Cardiovascular and Mediastinum   HTN (hypertension)    Excellent control.  Suspect weight loss has contributed to this.  For now we agreed to continue losartan 50 mg, hydrochlorothiazide  12.5 mg.  However have advised him to be very vigilant and monitor blood pressure at home and certainly for symptoms such as dizziness.  In the setting of continued weight loss, likely we will need to stop 1 antihypertensive         Other   Other fatigue    Complains of fatigue. No known etiology at  this time.  I suspect his sleep is adequate as 7 hours /night has been his normal for years.   Discussed whether CPAP would need to be titrated.  Pending thorough lab evaluation for metabolic, nutritional etiology.  Advised 6-week follow-up to discuss fatigue, further eval      Routine physical examination - Primary    Included PSAin  screening due to patient preference and family history of prostate cancer.  No family history colon cancer or changes in bowel habits, defer colonoscopy until he is 41 years of age.  Encouraged continued exercise.  Congratulated patient on weight loss.      Relevant Orders   Hemoglobin A1c   TSH   Lipid panel   CBC with Differential/Platelet   Comprehensive metabolic panel   H60 and Folate Panel   PSA     I am having Carlos Long "[name]" maintain his omeprazole, Fluticasone-Salmeterol, albuterol, albuterol, hydrochlorothiazide, and losartan.   No orders of the defined types were placed in this encounter.   Return precautions given.   Risks, benefits, and alternatives of the medications and treatment plan prescribed today were discussed, and patient expressed understanding.   Education regarding symptom management and diagnosis given to patient on AVS.   Continue to follow with Carlos Grana, FNP for routine health maintenance.   Carlos Long and I agreed with plan.   Rennie Plowman, FNP

## 2021-03-21 ENCOUNTER — Telehealth: Payer: Self-pay | Admitting: Family

## 2021-03-21 DIAGNOSIS — R7989 Other specified abnormal findings of blood chemistry: Secondary | ICD-10-CM

## 2021-03-21 LAB — CBC WITH DIFFERENTIAL/PLATELET
Basophils Absolute: 0.1 10*3/uL (ref 0.0–0.1)
Basophils Relative: 1 % (ref 0.0–3.0)
Eosinophils Absolute: 0.1 10*3/uL (ref 0.0–0.7)
Eosinophils Relative: 1.8 % (ref 0.0–5.0)
HCT: 42.6 % (ref 39.0–52.0)
Hemoglobin: 14.3 g/dL (ref 13.0–17.0)
Lymphocytes Relative: 27 % (ref 12.0–46.0)
Lymphs Abs: 2.1 10*3/uL (ref 0.7–4.0)
MCHC: 33.5 g/dL (ref 30.0–36.0)
MCV: 88.3 fl (ref 78.0–100.0)
Monocytes Absolute: 0.4 10*3/uL (ref 0.1–1.0)
Monocytes Relative: 5.7 % (ref 3.0–12.0)
Neutro Abs: 5.1 10*3/uL (ref 1.4–7.7)
Neutrophils Relative %: 64.5 % (ref 43.0–77.0)
Platelets: 227 10*3/uL (ref 150.0–400.0)
RBC: 4.83 Mil/uL (ref 4.22–5.81)
RDW: 12.4 % (ref 11.5–15.5)
WBC: 7.9 10*3/uL (ref 4.0–10.5)

## 2021-03-21 LAB — LIPID PANEL
Cholesterol: 187 mg/dL (ref 0–200)
HDL: 41.4 mg/dL (ref >39.00)
LDL Cholesterol: 117 mg/dL — ABNORMAL HIGH (ref 0–99)
NonHDL: 145.26
Total CHOL/HDL Ratio: 5
Triglycerides: 142 mg/dL (ref 0.0–149.0)
VLDL: 28.4 mg/dL (ref 0.0–40.0)

## 2021-03-21 LAB — TSH: TSH: 0.17 u[IU]/mL — ABNORMAL LOW (ref 0.35–5.50)

## 2021-03-21 LAB — COMPREHENSIVE METABOLIC PANEL
ALT: 28 U/L (ref 0–53)
AST: 22 U/L (ref 0–37)
Albumin: 4.8 g/dL (ref 3.5–5.2)
Alkaline Phosphatase: 62 U/L (ref 39–117)
BUN: 15 mg/dL (ref 6–23)
CO2: 31 mEq/L (ref 19–32)
Calcium: 9.9 mg/dL (ref 8.4–10.5)
Chloride: 99 mEq/L (ref 96–112)
Creatinine, Ser: 0.97 mg/dL (ref 0.40–1.50)
GFR: 95.94 mL/min (ref >60.00)
Glucose, Bld: 77 mg/dL (ref 70–99)
Potassium: 3.8 mEq/L (ref 3.5–5.1)
Sodium: 137 mEq/L (ref 135–145)
Total Bilirubin: 0.5 mg/dL (ref 0.2–1.2)
Total Protein: 7.1 g/dL (ref 6.0–8.3)

## 2021-03-21 LAB — PSA: PSA: 1.18 ng/mL (ref 0.10–4.00)

## 2021-03-21 LAB — B12 AND FOLATE PANEL
Folate: 15.1 ng/mL (ref >5.9)
Vitamin B-12: 374 pg/mL (ref 211–911)

## 2021-03-21 LAB — HEMOGLOBIN A1C: Hgb A1c MFr Bld: 5.5 % (ref 4.6–6.5)

## 2021-03-21 NOTE — Telephone Encounter (Signed)
Noreene Larsson, toya,   Can I add t3 and t4 to labs from yesterday?

## 2021-03-22 ENCOUNTER — Other Ambulatory Visit (INDEPENDENT_AMBULATORY_CARE_PROVIDER_SITE_OTHER): Payer: Managed Care, Other (non HMO)

## 2021-03-22 DIAGNOSIS — R7989 Other specified abnormal findings of blood chemistry: Secondary | ICD-10-CM

## 2021-03-22 LAB — T3, FREE: T3, Free: 3.8 pg/mL (ref 2.3–4.2)

## 2021-03-22 LAB — T4, FREE: Free T4: 0.98 ng/dL (ref 0.60–1.60)

## 2021-03-22 NOTE — Telephone Encounter (Signed)
Carlos Long pt  Please advise he comes back in for further thyroid studies  T3 ordered, please sch

## 2021-03-22 NOTE — Telephone Encounter (Signed)
Patient scheduled for labs tomorrow for T3 & T4.

## 2021-03-22 NOTE — Telephone Encounter (Signed)
Add on sheet faxed for Free T4 but too late to add on free T3

## 2021-03-23 ENCOUNTER — Other Ambulatory Visit: Payer: Managed Care, Other (non HMO)

## 2021-03-23 ENCOUNTER — Telehealth: Payer: Self-pay | Admitting: *Deleted

## 2021-03-23 NOTE — Telephone Encounter (Signed)
Update: I faxed over a add on sheet for the Free T4 on 03/22/21. They have resulted the Free T3 & FreeT4. So no need for patient to come in. I will call patient to notify him not to come in.

## 2021-03-23 NOTE — Telephone Encounter (Signed)
Pt added to our lab schedule by Sarah with no orders. Please place future orders.

## 2021-03-27 ENCOUNTER — Other Ambulatory Visit: Payer: Self-pay

## 2021-03-27 DIAGNOSIS — R7989 Other specified abnormal findings of blood chemistry: Secondary | ICD-10-CM

## 2021-03-29 ENCOUNTER — Other Ambulatory Visit: Payer: Self-pay | Admitting: Family

## 2021-03-29 DIAGNOSIS — R7989 Other specified abnormal findings of blood chemistry: Secondary | ICD-10-CM

## 2021-04-06 ENCOUNTER — Ambulatory Visit: Payer: Managed Care, Other (non HMO)

## 2021-04-29 ENCOUNTER — Telehealth: Payer: Self-pay | Admitting: Family

## 2021-04-29 NOTE — Telephone Encounter (Signed)
FYI Carlos Long ? ?[name],  ?Call patient ?Reviewing his chart  ?he is overdue for thyroid ultrasound and referral to endocrinology.  I am not sure what happened here.  Please asked if agreeable to getting both of these scheduled ? ? ?

## 2021-04-29 NOTE — Telephone Encounter (Signed)
-----   Message from Karlyne Greenspan sent at 04/26/2021  4:40 PM EST ----- ?Pt was scheduled on 04/06/2021  ?Pt cancelled on 04/03/2021  ?Patient (pt will call back to reschedule) ? ?----- Message ----- ?From: Allegra Grana, FNP ?Sent: 04/26/2021   2:21 PM EST ?To: Silvestre Moment [facility] ? ?What is status of US thyroid and endo ref? ? ? ? ?

## 2021-05-03 NOTE — Telephone Encounter (Signed)
noted 

## 2021-06-22 ENCOUNTER — Other Ambulatory Visit: Payer: Self-pay | Admitting: Family

## 2021-06-22 DIAGNOSIS — I10 Essential (primary) hypertension: Secondary | ICD-10-CM

## 2021-12-18 ENCOUNTER — Encounter: Payer: Self-pay | Admitting: Family

## 2022-02-05 ENCOUNTER — Ambulatory Visit: Payer: Managed Care, Other (non HMO) | Admitting: Family

## 2022-02-23 ENCOUNTER — Ambulatory Visit (INDEPENDENT_AMBULATORY_CARE_PROVIDER_SITE_OTHER): Payer: Managed Care, Other (non HMO)

## 2022-02-23 ENCOUNTER — Ambulatory Visit (INDEPENDENT_AMBULATORY_CARE_PROVIDER_SITE_OTHER): Payer: Managed Care, Other (non HMO) | Admitting: Family

## 2022-02-23 ENCOUNTER — Encounter: Payer: Self-pay | Admitting: Family

## 2022-02-23 ENCOUNTER — Other Ambulatory Visit: Payer: Self-pay

## 2022-02-23 VITALS — BP 138/86 | HR 71 | Temp 97.8°F | Ht 71.0 in | Wt 209.4 lb

## 2022-02-23 DIAGNOSIS — G8929 Other chronic pain: Secondary | ICD-10-CM

## 2022-02-23 DIAGNOSIS — M25521 Pain in right elbow: Secondary | ICD-10-CM

## 2022-02-23 DIAGNOSIS — M545 Low back pain, unspecified: Secondary | ICD-10-CM

## 2022-02-23 DIAGNOSIS — I10 Essential (primary) hypertension: Secondary | ICD-10-CM

## 2022-02-23 MED ORDER — LOSARTAN POTASSIUM 50 MG PO TABS: 100.0000 mg | ORAL_TABLET | Freq: Every evening | ORAL | 1 refills | Status: DC

## 2022-02-23 MED ORDER — PREDNISONE 10 MG PO TABS: ORAL_TABLET | ORAL | 0 refills | Status: DC

## 2022-02-23 MED ORDER — MELOXICAM 7.5 MG PO TABS: 7.5000 mg | ORAL_TABLET | Freq: Every day | ORAL | 1 refills | Status: DC | PRN

## 2022-02-23 NOTE — Progress Notes (Unsigned)
Assessment & Plan:  There are no diagnoses linked to this encounter.   Return precautions given.   Risks, benefits, and alternatives of the medications and treatment plan prescribed today were discussed, and patient expressed understanding.   Education regarding symptom management and diagnosis given to patient on AVS either electronically or printed.  No follow-ups on file.  Mable Paris, FNP  Subjective:    Patient ID: Carlos Long, male    DOB: Sep 17, 1980, 42 y.o.   MRN: [id number]  CC: Carlos Long is a 42 y.o. male who presents today for an acute visit.    HPI: Complains of sharp right elbow pain.  Previously had ache had been right medial forearm which has resolved with PT.  Elbow pain x 4 months.  He has noticed swelling over right elbow.   Left handed  He plays guitar right handed. Pain with grabbing, reaching,  and picking up a lightweight object.   No fever, numbness  Tried heat/ ice with slightly improvement.   He has been taking 200mg  qam and 400-600mg  ibuprofen qpm with some relief.    He also complains of low back pain for 20 years. Largely unchanged.  Worse as he has gained weight back.  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  No fever, weight loss, trouble urinating, numbness, saddle anesthesia   HTN- compliant with losartan 100mg  qd. He is no longer on hctz 12.5mg .    Continues to have  right lateral elbow pain.    Initial consult note Dr Marry Guan 11/03/2021 for lateral epicondylitis of the right elbow Advised corticosteroid injection and if needed MRI to rule out possible tendon tear of the common extensor tendon He had physical therapy which was very helpgul  Allergies: Amlodipine Current Outpatient Medications on File Prior to Visit  Medication Sig Dispense Refill   albuterol (PROVENTIL) (5 MG/ML) 0.5% nebulizer solution Take 0.5 mLs (2.5 mg total) by nebulization every 6 (six) hours as needed for wheezing or  shortness of breath. 20 mL 2   albuterol (VENTOLIN HFA) 108 (90 Base) MCG/ACT inhaler Inhale 2 puffs into the lungs every 6 (six) hours as needed for wheezing or shortness of breath. 8 g 2   hydrochlorothiazide (MICROZIDE) 12.5 MG capsule TAKE 1 CAPSULE BY MOUTH EVERY DAY 90 capsule 1   Fluticasone-Salmeterol (ADVAIR DISKUS) 100-50 MCG/DOSE AEPB Inhale 1 puff into the lungs 2 (two) times daily. (Patient not taking: Reported on 03/20/2021) 60 each 3   omeprazole (PRILOSEC) 20 MG capsule Take by mouth.     No current facility-administered medications on file prior to visit.    Review of Systems  Constitutional:  Negative for chills and fever.  Respiratory:  Negative for cough.   Cardiovascular:  Negative for chest pain and palpitations.  Gastrointestinal:  Negative for nausea and vomiting.  Musculoskeletal:  Positive for arthralgias and back pain.  Neurological:  Negative for numbness.      Objective:    BP 138/86   Pulse 71   Temp 97.8 F (36.6 C) (Oral)   Ht 5\' 11"  (1.803 m)   Wt 209 lb 6.4 oz (95 kg)   SpO2 98%   BMI 29.21 kg/m   BP Readings from Last 3 Encounters:  02/23/22 138/86  03/20/21 122/72  10/03/20 (!) 124/92   Wt Readings from Last 3 Encounters:  02/23/22 209 lb 6.4 oz (95 kg)  03/20/21 198 lb 12.8 oz (90.2 kg)  10/03/20 210 lb 3.2 oz (95.3 kg)  Physical Exam Vitals reviewed.  Constitutional:      Appearance: He is well-developed.  Cardiovascular:     Rate and Rhythm: Regular rhythm.     Heart sounds: Normal heart sounds.  Pulmonary:     Effort: Pulmonary effort is normal. No respiratory distress.     Breath sounds: Normal breath sounds. No wheezing or rales.  Musculoskeletal:     Right elbow: No swelling or effusion.     Left elbow: No swelling or effusion. No tenderness.     Lumbar back: No swelling, spasms or tenderness. Normal range of motion.     Comments: Full range of motion with flexion, extension, lateral side bends. No pain, numbness,  tingling elicited with single leg raise bilaterally. No rash. Full ROM with flexion, extension, supination, and pronation.  Strength 5/5. Pain right elbow with squeezing hand.  No ecchymosis or swelling.   Skin:    General: Skin is warm and dry.  Neurological:     Mental Status: He is alert.  Psychiatric:        Speech: Speech normal.        Behavior: Behavior normal.

## 2022-02-23 NOTE — Patient Instructions (Signed)
Start prednisone tomorrow morning as prednisone can interfere with sleep.  Trial of meloxicam INSTEAD of ibuprofen  A couple of points in regards to meloxicam ( Mobic) -  This medication is not intended for daily , long term use. It is a potent anti inflammatory ( NSAID), and my intention is for you take as needed for moderate to severe pain. If you find yourself using daily, please let me know.   Please takes Mobic ( meloxicam) with FOOD since it is an anti-inflammatory as it can cause a GI bleed or ulcer. If you have a history of GI bleed or ulcer, please do NOT take.  Do no take over the counter aleve, motrin, advil, goody's powder for pain as they are also NSAIDs, and they are  in the same class as Mobic  Lastly, we will need to monitor kidney function while on Mobic, and if we were to see any decline in kidney function in the future, we would have to discontinue this medication.     Low Back Sprain or Strain Rehab Ask your health care provider which exercises are safe for you. Do exercises exactly as told by your health care provider and adjust them as directed. It is normal to feel mild stretching, pulling, tightness, or discomfort as you do these exercises. Stop right away if you feel sudden pain or your pain gets worse. Do not begin these exercises until told by your health care provider. Stretching and range-of-motion exercises These exercises warm up your muscles and joints and improve the movement and flexibility of your back. These exercises also help to relieve pain, numbness, and tingling. Lumbar rotation  Lie on your back on a firm bed or the floor with your knees bent. Straighten your arms out to your sides so each arm forms a 90-degree angle (right angle) with a side of your body. Slowly move (rotate) both of your knees to one side of your body until you feel a stretch in your lower back (lumbar). Try not to let your shoulders lift off the floor. Hold this position for  __________ seconds. Tense your abdominal muscles and slowly move your knees back to the starting position. Repeat this exercise on the other side of your body. Repeat __________ times. Complete this exercise __________ times a day. Single knee to chest  Lie on your back on a firm bed or the floor with both legs straight. Bend one of your knees. Use your hands to move your knee up toward your chest until you feel a gentle stretch in your lower back and buttock. Hold your leg in this position by holding on to the front of your knee. Keep your other leg as straight as possible. Hold this position for __________ seconds. Slowly return to the starting position. Repeat with your other leg. Repeat __________ times. Complete this exercise __________ times a day. Prone extension on elbows  Lie on your abdomen on a firm bed or the floor (prone position). Prop yourself up on your elbows. Use your arms to help lift your chest up until you feel a gentle stretch in your abdomen and your lower back. This will place some of your body weight on your elbows. If this is uncomfortable, try stacking pillows under your chest. Your hips should stay down, against the surface that you are lying on. Keep your hip and back muscles relaxed. Hold this position for __________ seconds. Slowly relax your upper body and return to the starting position. Repeat __________ times. Complete  this exercise __________ times a day. Strengthening exercises These exercises build strength and endurance in your back. Endurance is the ability to use your muscles for a long time, even after they get tired. Pelvic tilt This exercise strengthens the muscles that lie deep in the abdomen. Lie on your back on a firm bed or the floor with your legs extended. Bend your knees so they are pointing toward the ceiling and your feet are flat on the floor. Tighten your lower abdominal muscles to press your lower back against the floor. This  motion will tilt your pelvis so your tailbone points up toward the ceiling instead of pointing to your feet or the floor. To help with this exercise, you may place a small towel under your lower back and try to push your back into the towel. Hold this position for __________ seconds. Let your muscles relax completely before you repeat this exercise. Repeat __________ times. Complete this exercise __________ times a day. Alternating arm and leg raises  Get on your hands and knees on a firm surface. If you are on a hard floor, you may want to use padding, such as an exercise mat, to cushion your knees. Line up your arms and legs. Your hands should be directly below your shoulders, and your knees should be directly below your hips. Lift your left leg behind you. At the same time, raise your right arm and straighten it in front of you. Do not lift your leg higher than your hip. Do not lift your arm higher than your shoulder. Keep your abdominal and back muscles tight. Keep your hips facing the ground. Do not arch your back. Keep your balance carefully, and do not hold your breath. Hold this position for __________ seconds. Slowly return to the starting position. Repeat with your right leg and your left arm. Repeat __________ times. Complete this exercise __________ times a day. Abdominal set with straight leg raise  Lie on your back on a firm bed or the floor. Bend one of your knees and keep your other leg straight. Tense your abdominal muscles and lift your straight leg up, 4-6 inches (10-15 cm) off the ground. Keep your abdominal muscles tight and hold this position for __________ seconds. Do not hold your breath. Do not arch your back. Keep it flat against the ground. Keep your abdominal muscles tense as you slowly lower your leg back to the starting position. Repeat with your other leg. Repeat __________ times. Complete this exercise __________ times a day. Single leg lower with bent  knees Lie on your back on a firm bed or the floor. Tense your abdominal muscles and lift your feet off the floor, one foot at a time, so your knees and hips are bent in 90-degree angles (right angles). Your knees should be over your hips and your lower legs should be parallel to the floor. Keeping your abdominal muscles tense and your knee bent, slowly lower one of your legs so your toe touches the ground. Lift your leg back up to return to the starting position. Do not hold your breath. Do not let your back arch. Keep your back flat against the ground. Repeat with your other leg. Repeat __________ times. Complete this exercise __________ times a day. Posture and body mechanics Good posture and healthy body mechanics can help to relieve stress in your body's tissues and joints. Body mechanics refers to the movements and positions of your body while you do your daily activities. Posture is part  of body mechanics. Good posture means: Your spine is in its natural S-curve position (neutral). Your shoulders are pulled back slightly. Your head is not tipped forward (neutral). Follow these guidelines to improve your posture and body mechanics in your everyday activities. Standing  When standing, keep your spine neutral and your feet about hip-width apart. Keep a slight bend in your knees. Your ears, shoulders, and hips should line up. When you do a task in which you stand in one place for a long time, place one foot up on a stable object that is 2-4 inches (5-10 cm) high, such as a footstool. This helps keep your spine neutral. Sitting  When sitting, keep your spine neutral and keep your feet flat on the floor. Use a footrest, if necessary, and keep your thighs parallel to the floor. Avoid rounding your shoulders, and avoid tilting your head forward. When working at a desk or a computer, keep your desk at a height where your hands are slightly lower than your elbows. Slide your chair under your desk  so you are close enough to maintain good posture. When working at a computer, place your monitor at a height where you are looking straight ahead and you do not have to tilt your head forward or downward to look at the screen. Resting When lying down and resting, avoid positions that are most painful for you. If you have pain with activities such as sitting, bending, stooping, or squatting, lie in a position in which your body does not bend very much. For example, avoid curling up on your side with your arms and knees near your chest (fetal position). If you have pain with activities such as standing for a long time or reaching with your arms, lie with your spine in a neutral position and bend your knees slightly. Try the following positions: Lying on your side with a pillow between your knees. Lying on your back with a pillow under your knees. Lifting  When lifting objects, keep your feet at least shoulder-width apart and tighten your abdominal muscles. Bend your knees and hips and keep your spine neutral. It is important to lift using the strength of your legs, not your back. Do not lock your knees straight out. Always ask for help to lift heavy or awkward objects. This information is not intended to replace advice given to you by your health care provider. Make sure you discuss any questions you have with your health care provider. Document Revised: 04/25/2020 Document Reviewed: 04/25/2020 Elsevier Patient Education  2023 [employer]  Elbow and Forearm Exercises Ask your health care provider which exercises are safe for you. Do exercises exactly as told by your health care provider and adjust them as directed. It is normal to feel mild stretching, pulling, tightness, or discomfort as you do these exercises. Stop right away if you feel sudden pain or your pain gets worse. Do not begin these exercises until told by your health care provider. Range-of-motion exercises These exercises warm up  your muscles and joints and improve the movement and flexibility of your injured elbow and forearm. The exercises also help to relieve pain, numbness, and tingling. These exercises are done using the muscles in your injured elbow and forearm (active). Elbow flexion, active Hold your left / right arm at your side, and bend your elbow (flexion) as far as you can using only your arm muscles. Hold this position for __________ seconds. Slowly return to the starting position. Repeat __________ times. Complete this  exercise __________ times a day. Elbow extension, active Hold your left / right arm at your side, and straighten your elbow (extension) as much as you can using only your arm muscles. Hold this position for __________ seconds. Slowly return to the starting position. Repeat __________ times. Complete this exercise __________ times a day. Active forearm rotation, supination This is an exercise in which you turn (rotate) your forearm palm up (supination). Stand or sit with your elbows at your sides. Bend your left / right elbow to a 90-degree angle (right angle). Rotate your palm up until you feel a gentle stretch on the inside of your forearm. Hold this position for __________ seconds. Slowly return to the starting position. Repeat __________ times. Complete this exercise __________ times a day. Active forearm rotation, pronation This is an exercise in which you turn (rotate) your forearm palm down (pronation). Stand or sit with your elbows at your sides. Bend your left / right elbow to a 90-degree angle (right angle). Rotate your palm down until you feel a gentle stretch on the top of your forearm. Hold this position for __________ seconds. Slowly return to the starting position. Repeat __________ times. Complete this exercise __________ times a day. Stretching exercises These exercises warm up your muscles and joints and improve the movement and flexibility of your injured elbow and  forearm. These exercises also help to relieve pain, numbness, and tingling. These exercises are done using your healthy elbow and forearm to help stretch the muscles in your injured elbow and forearm (active-assisted). Elbow flexion, active-assisted  Hold your left / right arm at your side, and bend your elbow (flexion) as much as you can using your left / right arm muscles. Use your other hand to bend your left / right elbow farther. To do this, gently push up on your forearm until you feel a gentle stretch on the back of your elbow. Hold this position for __________ seconds. Slowly return to the starting position. Repeat __________ times. Complete this exercise __________ times a day. Elbow extension, active-assisted  Hold your left / right arm at your side, and straighten your elbow (extension) as much as you can using your left / right arm muscles. Use your other hand to straighten the left / right elbow farther. To do this, gently push down on your forearm until you feel a gentle stretch on the inside of your elbow. Hold this position for __________ seconds. Slowly return to the starting position. Repeat __________ times. Complete this exercise __________ times a day. Active-assisted forearm rotation, supination This is an exercise in which you turn (rotate) your forearm palm up (supination). Sit with your left / right elbow bent in a 90-degree angle (right angle) with your forearm resting on a table. Keeping your upper body and shoulder still, rotate your forearm so your palm faces upward. Use your other hand to help rotate your forearm further until you feel a gentle to moderate stretch. Hold this position for __________ seconds. Slowly release the stretch and return to the starting position. Repeat __________ times. Complete this exercise __________ times a day. Active-assisted forearm rotation, pronation This is an exercise in which you turn (rotate) your forearm palm down  (pronation). Sit with your left / right elbow bent in a 90-degree angle (right angle) with your forearm resting on a table. Keeping your upper body and shoulder still, rotate your forearm so your palm faces the tabletop. Use your other hand to help rotate your forearm further until you feel  a gentle to moderate stretch. Hold this position for __________ seconds. Slowly release the stretch and return to the starting position. Repeat __________ times. Complete this exercise __________ times a day. Passive elbow flexion, supine Lie on your back (supine position). Extend your left / right arm up in the air, bracing it with your other hand. Let your left / right hand slowly lower toward your shoulder (passive flexion), while your elbow stays pointed toward the ceiling. You should feel a gentle stretch along the back of your upper arm and elbow. If instructed by your health care provider, you may increase the intensity of your stretch by adding a small wrist weight or hand weight. Hold this position for __________ seconds. Slowly return to the starting position. Repeat __________ times. Complete this exercise __________ times a day. Passive elbow extension, supine  Lie on your back (supine position). Make sure that you are in a comfortable position that lets you relax your arm muscles. Place a folded towel under your left / right upper arm so your elbow and shoulder are at the same height. Straighten your left / right arm so your elbow does not rest on the bed or towel. Let the weight of your hand stretch your elbow (passive extension). Keep your arm and chest muscles relaxed. You should feel a stretch on the inside of your elbow. If told by your health care provider, you may increase the intensity of your stretch by adding a small wrist weight or hand weight. Hold this position for __________ seconds. Slowly release the stretch. Repeat __________ times. Complete this exercise __________ times a  day. Strengthening exercises These exercises build strength and endurance in your elbow and forearm. Endurance is the ability to use your muscles for a long time, even after they get tired. Elbow flexion, isometric  Stand or sit up straight. Bend your left / right elbow in a 90-degree angle (right angle), and keep your forearm at the height of your waist. Your thumb should be pointed toward the ceiling (neutral forearm). Place your other hand on top of your left / right forearm. Gently push down while you resist with your left / right arm (isometric flexion). Push as hard as you can with both arms without causing any pain or movement at your left / right elbow. Hold this position for __________ seconds. Slowly release the tension in both arms. Let your muscles relax completely before you repeat the exercise. Repeat __________ times. Complete this exercise __________ times a day. Elbow extension, isometric  Stand or sit up straight. Place your left / right arm so your palm faces your abdomen and is at the height of your waist. Place your other hand on the underside of your left / right forearm. Gently push up while you resist with your left / right arm (isometric extension). Push as hard as you can with both arms without causing any pain or movement at your left / right elbow. Hold this position for __________ seconds. Slowly release the tension in both arms. Let your muscles relax completely before you repeat the exercise. Repeat __________ times. Complete this exercise __________ times a day. Elbow flexion with forearm palm up  Sit on a firm chair without armrests, or stand up. Place your left / right arm at your side with your elbow straight and your palm facing forward. Holding a __________weight or gripping a rubber exercise band or tubing, bend your elbow to bring your hand toward your shoulder (flexion). Hold this position for  __________ seconds. Slowly return to the starting  position. Repeat __________ times. Complete this exercise __________ times a day. Elbow extension, active  Sit on a firm chair without armrests, or stand up. Hold a rubber exercise band or tubing in both hands. Keeping your upper arms at your sides, bring both hands up to your left / right shoulder. Keep your left / right hand just below your other hand. Straighten your left / right elbow (extension) while keeping your other arm still. Hold this position for __________ seconds. Control the resistance of the band or tubing as you return to the starting position. Repeat __________ times. Complete this exercise __________ times a day. Forearm rotation, supination  Sit with your left / right forearm supported on a table. Your elbow should be at waist height and bent at a 90-degree angle (right angle). Gently grasp a lightweight hammer. Rest your hand over the edge of the table with your palm facing down. Without moving your left / right elbow, slowly rotate your forearm to turn your palm up toward the ceiling (supination). Hold this position for __________ seconds. Slowly return to the starting position. Repeat __________ times. Complete this exercise __________ times a day. Forearm rotation, pronation  Sit with your left / right forearm supported on a table. Keep your elbow below shoulder height. Gently grasp a lightweight hammer. Rest your hand over the edge of the table with your palm facing up. Without moving your left / right elbow, slowly rotate your forearm to turn your palm down toward the floor (pronation). Hold this position for __________seconds. Slowly return to the starting position. Repeat __________ times. Complete this exercise __________ times a day. This information is not intended to replace advice given to you by your health care provider. Make sure you discuss any questions you have with your health care provider. Document Revised: 05/29/2018 Document Reviewed:  02/26/2018 Elsevier Patient Education  2023 [employer].

## 2022-02-26 ENCOUNTER — Encounter: Payer: Self-pay | Admitting: Family

## 2022-02-27 NOTE — Assessment & Plan Note (Signed)
Elevated.  Suspect weight gain, NSAID use contributory. Advised to monitor blood pressure and resume hydrochlorothiazide 12.5 mg daily.  Continue losartan 100 mg daily

## 2022-02-27 NOTE — Assessment & Plan Note (Signed)
Presentation consistent with olecranon bursitis. Trial of prednisone. Plan  orthopedic consult /PT if pain doesn't improve .

## 2022-02-27 NOTE — Assessment & Plan Note (Signed)
Chronic, largely unchanged.  X-ray reveals degenerative disc disease L4-L5.  Advised trial meloxicam, home exercises provided.

## 2022-03-02 ENCOUNTER — Other Ambulatory Visit: Payer: Self-pay

## 2022-03-02 DIAGNOSIS — I10 Essential (primary) hypertension: Secondary | ICD-10-CM

## 2022-03-02 MED ORDER — HYDROCHLOROTHIAZIDE 12.5 MG PO CAPS: ORAL_CAPSULE | ORAL | 2 refills | Status: DC

## 2022-03-02 NOTE — Telephone Encounter (Signed)
Refilled medication for 6 mnths but was unable to reach pt. LVM to call back

## 2022-03-05 NOTE — Telephone Encounter (Signed)
Spoke to pt and scheduled a f/up appt for 04/18/22

## 2022-04-18 ENCOUNTER — Encounter: Payer: Self-pay | Admitting: Family

## 2022-04-18 ENCOUNTER — Ambulatory Visit: Payer: Managed Care, Other (non HMO) | Admitting: Family

## 2022-04-18 VITALS — BP 136/78 | HR 75 | Temp 98.8°F | Ht 71.0 in | Wt 212.0 lb

## 2022-04-18 DIAGNOSIS — R002 Palpitations: Secondary | ICD-10-CM | POA: Diagnosis not present

## 2022-04-18 NOTE — Progress Notes (Signed)
Assessment & Plan:  Palpitations Assessment & Plan: Fortunately episode was without associated chest pain or shortness of breath.  Episode resolved prior to patient coming to the office today.  EKG in the office today shows sinus rhythm.  Compared to previous EKG on 12/17/2016 without significant changes.  I placed an order for a Zio monitor.  Pending labs .  Strict return precautions given to patient;  if palpitations were to recur or be associated with chest pain, shortness of breath, patient understands to call 911 or report to nearest emergency room  Orders: -     CBC with Differential/Platelet; Future -     Comprehensive metabolic panel; Future -     Lipid panel; Future -     TSH; Future -     EKG 12-Lead -     LONG TERM MONITOR (3-14 DAYS); Future     Return precautions given.   Risks, benefits, and alternatives of the medications and treatment plan prescribed today were discussed, and patient expressed understanding.   Education regarding symptom management and diagnosis given to patient on AVS either electronically or printed.  Return in about 6 months (around 10/17/2022) for Fasting labs in 2-3 weeks.  Mable Paris, FNP  Subjective:    Patient ID: Carlos Long, male    DOB: 1981/01/21, 42 y.o.   MRN: [id number]  CC: Carlos Long is a 42 y.o. male who presents today for follow up.   HPI: Complains of 'flutter' sensation earlier this afternoon while sitting at work.  Lasted for couple seconds and then resolved on its own.  Last episode such as this was 7 years ago  No associated numbness, cp, sob, dizziness  He had felt agitated while dealing with a child's behavior at that time.   Drinks 2 cups of caffeine.       Allergies: Amlodipine Current Outpatient Medications on File Prior to Visit  Medication Sig Dispense Refill   albuterol (PROVENTIL) (5 MG/ML) 0.5% nebulizer solution Take 0.5 mLs (2.5 mg total) by nebulization every 6 (six) hours as needed  for wheezing or shortness of breath. 20 mL 2   albuterol (VENTOLIN HFA) 108 (90 Base) MCG/ACT inhaler Inhale 2 puffs into the lungs every 6 (six) hours as needed for wheezing or shortness of breath. 8 g 2   hydrochlorothiazide (MICROZIDE) 12.5 MG capsule TAKE 1 CAPSULE BY MOUTH EVERY DAY 90 capsule 2   losartan (COZAAR) 50 MG tablet Take 2 tablets (100 mg total) by mouth every evening. 180 tablet 1   meloxicam (MOBIC) 7.5 MG tablet Take 1 tablet (7.5 mg total) by mouth daily as needed for pain. 30 tablet 1   omeprazole (PRILOSEC) 20 MG capsule Take by mouth.     No current facility-administered medications on file prior to visit.    Review of Systems  Constitutional:  Negative for chills and fever.  Respiratory:  Negative for cough.   Cardiovascular:  Positive for palpitations. Negative for chest pain.  Gastrointestinal:  Negative for nausea and vomiting.      Objective:    BP 136/78   Pulse 75   Temp 98.8 F (37.1 C) (Oral)   Ht '5\' 11"'$  (1.803 m)   Wt 212 lb (96.2 kg)   SpO2 97%   BMI 29.57 kg/m  BP Readings from Last 3 Encounters:  04/18/22 136/78  02/23/22 138/86  03/20/21 122/72   Wt Readings from Last 3 Encounters:  04/18/22 212 lb (96.2 kg)  02/23/22 209 lb 6.4 oz (95  kg)  03/20/21 198 lb 12.8 oz (90.2 kg)    Physical Exam Vitals reviewed.  Constitutional:      Appearance: He is well-developed.  Cardiovascular:     Rate and Rhythm: Regular rhythm.     Heart sounds: Normal heart sounds.  Pulmonary:     Effort: Pulmonary effort is normal. No respiratory distress.     Breath sounds: Normal breath sounds. No wheezing, rhonchi or rales.  Skin:    General: Skin is warm and dry.  Neurological:     Mental Status: He is alert.  Psychiatric:        Speech: Speech normal.        Behavior: Behavior normal.

## 2022-04-18 NOTE — Patient Instructions (Signed)
I have ordered a 14 day Zio monitor which will mailed directly to you. The device will include instructions on how to apply.   The Zio 14 day monitor that we use DOES NOT have 24 hour live monitoring. The rhythm of your heart will not be monitored while you are wearing it. Only AFTER you mail the device back in and a cardiologist interprets the data will we know if an underlying cardiac arrhythmia is going on.   There are models that offer 24 hour live monitoring however NOT this one. I wanted to be sure that you were aware of this as certainly didn't want this to be a false sense of security.   If you experience chest pain, shortness of breath, left arm numbness, or sustained, more frequent palpitations , do not wait and call 911 right away. We are in a delicate period of work up regarding palpitations and until we are sure that you do not have an underlying arrhythmia, you must be extremely vigilant and cautious.   Palpitations Palpitations are feelings that your heartbeat is irregular or is faster than normal. It may feel like your heart is fluttering or skipping a beat. Palpitations may be caused by many things, including smoking, caffeine, alcohol, stress, and certain medicines or drugs. Most causes of palpitations are not serious.  However, some palpitations can be a sign of a serious problem. Further tests and a thorough medical history will be done to find the cause of your palpitations. Your provider may order tests such as an ECG, labs, an echocardiogram, or an ambulatory continuous ECG monitor. Follow these instructions at home: Pay attention to any changes in your symptoms. Let your health care provider know about them. Take these actions to help manage your symptoms: Eating and drinking Follow instructions from your health care provider about eating or drinking restrictions. You may need to avoid foods and drinks that may cause palpitations. These may include: Caffeinated coffee, tea,  soft drinks, and energy drinks. Chocolate. Alcohol. Diet pills. Lifestyle     Take steps to reduce your stress and anxiety. Things that can help you relax include: Yoga. Mind-body activities, such as deep breathing, meditation, or using words and images to create positive thoughts (guided imagery). Physical activity, such as swimming, jogging, or walking. Tell your health care provider if your palpitations increase with activity. If you have chest pain or shortness of breath with activity, do not continue the activity until you are seen by your health care provider. Biofeedback. This is a method that helps you learn to use your mind to control things in your body, such as your heartbeat. Get plenty of rest and sleep. Keep a regular bed time. Do not use drugs, including cocaine or ecstasy. Do not use marijuana. Do not use any products that contain nicotine or tobacco. These products include cigarettes, chewing tobacco, and vaping devices, such as e-cigarettes. If you need help quitting, ask your health care provider. General instructions Take over-the-counter and prescription medicines only as told by your health care provider. Keep all follow-up visits. This is important. These may include visits for further testing if palpitations do not go away or get worse. Contact a health care provider if: You continue to have a fast or irregular heartbeat for a long period of time. You notice that your palpitations occur more often. Get help right away if: You have chest pain or shortness of breath. You have a severe headache. You feel dizzy or you faint. These symptoms  may represent a serious problem that is an emergency. Do not wait to see if the symptoms will go away. Get medical help right away. Call your local emergency services (911 in the [place] Do not drive yourself to the hospital. Summary Palpitations are feelings that your heartbeat is irregular or is faster than normal. It may feel like  your heart is fluttering or skipping a beat. Palpitations may be caused by many things, including smoking, caffeine, alcohol, stress, certain medicines, and drugs. Further tests and a thorough medical history may be done to find the cause of your palpitations. Get help right away if you faint or have chest pain, shortness of breath, severe headache, or dizziness. This information is not intended to replace advice given to you by your health care provider. Make sure you discuss any questions you have with your health care provider. Document Revised: 06/29/2020 Document Reviewed: 06/29/2020 Else[employer] Patient Education  [place].

## 2022-04-19 ENCOUNTER — Ambulatory Visit: Payer: Managed Care, Other (non HMO) | Attending: Family

## 2022-04-19 DIAGNOSIS — R002 Palpitations: Secondary | ICD-10-CM

## 2022-04-20 NOTE — Assessment & Plan Note (Addendum)
Fortunately episode was without associated chest pain or shortness of breath.  Episode resolved prior to patient coming to the office today.  EKG in the office today shows sinus rhythm.  Compared to previous EKG on 12/17/2016 without significant changes.  I placed an order for a Zio monitor.  Pending labs .  Strict return precautions given to patient;  if palpitations were to recur or be associated with chest pain, shortness of breath, patient understands to call 911 or report to nearest emergency room

## 2022-04-23 ENCOUNTER — Encounter: Payer: Self-pay | Admitting: Family

## 2022-04-23 DIAGNOSIS — R002 Palpitations: Secondary | ICD-10-CM

## 2022-05-01 ENCOUNTER — Other Ambulatory Visit: Payer: Self-pay | Admitting: Family

## 2022-05-01 DIAGNOSIS — M25521 Pain in right elbow: Secondary | ICD-10-CM

## 2022-05-02 ENCOUNTER — Other Ambulatory Visit: Payer: Managed Care, Other (non HMO)

## 2022-05-22 ENCOUNTER — Other Ambulatory Visit: Payer: Self-pay | Admitting: Family

## 2022-05-22 DIAGNOSIS — R002 Palpitations: Secondary | ICD-10-CM

## 2022-06-09 ENCOUNTER — Other Ambulatory Visit: Payer: Self-pay | Admitting: Family

## 2022-06-09 DIAGNOSIS — M25521 Pain in right elbow: Secondary | ICD-10-CM

## 2022-06-11 ENCOUNTER — Encounter: Payer: Self-pay | Admitting: Family

## 2022-06-12 NOTE — Telephone Encounter (Signed)
Spoke to pt and scheduled him a f/up appt for Thursday 06/14/22 :30 pm

## 2022-06-14 ENCOUNTER — Ambulatory Visit (INDEPENDENT_AMBULATORY_CARE_PROVIDER_SITE_OTHER): Payer: Managed Care, Other (non HMO) | Admitting: Family

## 2022-06-14 ENCOUNTER — Encounter: Payer: Self-pay | Admitting: Family

## 2022-06-14 VITALS — BP 118/70 | HR 70 | Temp 98.3°F | Ht 71.0 in | Wt 205.6 lb

## 2022-06-14 DIAGNOSIS — I10 Essential (primary) hypertension: Secondary | ICD-10-CM

## 2022-06-14 DIAGNOSIS — R1032 Left lower quadrant pain: Secondary | ICD-10-CM

## 2022-06-14 NOTE — Progress Notes (Signed)
Assessment & Plan:  Primary hypertension Assessment & Plan: Chronic, stable.  Continue hydrochlorothiazide 12.5 mg daily,losartan 100 mg daily   Left lower quadrant abdominal pain Assessment & Plan: Reassuring exam.  Pain is completely resolved.  Advised patient standard of care regarding colonoscopy after resolution of diverticulitis.  Patient politely declines at this time and prefers to have colonoscopy at age 42.      Return precautions given.   Risks, benefits, and alternatives of the medications and treatment plan prescribed today were discussed, and patient expressed understanding.   Education regarding symptom management and diagnosis given to patient on AVS either electronically or printed.  Return in about 6 months (around 12/14/2022) for Fasting labs in 2-3 weeks.  Rennie Plowman, FNP  Subjective:    Patient ID: Carlos Long, male    DOB: 12/08/1980, 42 y.o.   MRN: [id number]  CC: Carlos Long is a 42 y.o. male who presents today for follow up.   HPI: He was seen for 12/09/2022 for left lower quadrant pain.  Suspected diverticulitis.   pain has completely resolved.  He is afebrile.  Denies constipation ,nausea or vomiting.  Completed ciprofloxacin metronidazole, for presumed diverticulitis.  History of appendectomy.  No history of renal stone. Urinalysis without RBCs.  White blood cells 11 No obstruction seen on abdominal x-ray     Overall pleased with blood pressure regimen at this time.  He is lost weight intentionally.   Allergies: Amlodipine Current Outpatient Medications on File Prior to Visit  Medication Sig Dispense Refill   albuterol (PROVENTIL) (5 MG/ML) 0.5% nebulizer solution Take 0.5 mLs (2.5 mg total) by nebulization every 6 (six) hours as needed for wheezing or shortness of breath. 20 mL 2   albuterol (VENTOLIN HFA) 108 (90 Base) MCG/ACT inhaler Inhale 2 puffs into the lungs every 6 (six) hours as needed for wheezing or shortness of  breath. 8 g 2   hydrochlorothiazide (MICROZIDE) 12.5 MG capsule TAKE 1 CAPSULE BY MOUTH EVERY DAY 90 capsule 2   losartan (COZAAR) 50 MG tablet Take 2 tablets (100 mg total) by mouth every evening. 180 tablet 1   meloxicam (MOBIC) 7.5 MG tablet TAKE 1 TABLET BY MOUTH EVERY DAY AS NEEDED FOR PAIN 30 tablet 2   omeprazole (PRILOSEC) 20 MG capsule Take by mouth.     No current facility-administered medications on file prior to visit.    Review of Systems  Constitutional:  Negative for chills and fever.  Respiratory:  Negative for cough.   Cardiovascular:  Negative for chest pain and palpitations.  Gastrointestinal:  Negative for abdominal distention, abdominal pain, constipation, nausea and vomiting.  Genitourinary:  Negative for dysuria.      Objective:    BP 118/70   Pulse 70   Temp 98.3 F (36.8 C) (Oral)   Ht 5\' 11"  (1.803 m)   Wt 205 lb 9.6 oz (93.3 kg)   SpO2 97%   BMI 28.68 kg/m  BP Readings from Last 3 Encounters:  06/14/22 118/70  04/18/22 136/78  02/23/22 138/86   Wt Readings from Last 3 Encounters:  06/14/22 205 lb 9.6 oz (93.3 kg)  04/18/22 212 lb (96.2 kg)  02/23/22 209 lb 6.4 oz (95 kg)    Physical Exam Vitals reviewed.  Constitutional:      Appearance: Normal appearance. He is well-developed.  Cardiovascular:     Rate and Rhythm: Regular rhythm.     Heart sounds: Normal heart sounds.  Pulmonary:     Effort: Pulmonary effort  is normal. No respiratory distress.     Breath sounds: Normal breath sounds. No wheezing or rales.  Abdominal:     General: Bowel sounds are normal. There is no distension.     Palpations: Abdomen is soft. Abdomen is not rigid. There is no fluid wave or mass.     Tenderness: There is no abdominal tenderness. There is no guarding or rebound. Negative signs include Murphy's sign and McBurney's sign.     Comments: Negative heel jar test  Skin:    General: Skin is warm and dry.  Neurological:     Mental Status: He is alert.   Psychiatric:        Speech: Speech normal.        Behavior: Behavior normal.

## 2022-06-16 DIAGNOSIS — R109 Unspecified abdominal pain: Secondary | ICD-10-CM | POA: Insufficient documentation

## 2022-06-16 NOTE — Assessment & Plan Note (Signed)
Reassuring exam.  Pain is completely resolved.  Advised patient standard of care regarding colonoscopy after resolution of diverticulitis.  Patient politely declines at this time and prefers to have colonoscopy at age 42.

## 2022-06-16 NOTE — Assessment & Plan Note (Signed)
Chronic, stable.  Continue hydrochlorothiazide 12.5 mg daily,losartan 100 mg daily

## 2022-07-04 ENCOUNTER — Encounter: Payer: Self-pay | Admitting: Family

## 2022-07-04 ENCOUNTER — Other Ambulatory Visit: Payer: Managed Care, Other (non HMO)

## 2022-07-04 DIAGNOSIS — R002 Palpitations: Secondary | ICD-10-CM | POA: Diagnosis not present

## 2022-07-04 LAB — CBC WITH DIFFERENTIAL/PLATELET
Basophils Absolute: 0 10*3/uL (ref 0.0–0.1)
Basophils Relative: 0.7 % (ref 0.0–3.0)
Eosinophils Absolute: 0.1 10*3/uL (ref 0.0–0.7)
Eosinophils Relative: 2.1 % (ref 0.0–5.0)
HCT: 45.7 % (ref 39.0–52.0)
Hemoglobin: 15.4 g/dL (ref 13.0–17.0)
Lymphocytes Relative: 27.9 % (ref 12.0–46.0)
Lymphs Abs: 1.6 10*3/uL (ref 0.7–4.0)
MCHC: 33.6 g/dL (ref 30.0–36.0)
MCV: 88.6 fl (ref 78.0–100.0)
Monocytes Absolute: 0.4 10*3/uL (ref 0.1–1.0)
Monocytes Relative: 6.9 % (ref 3.0–12.0)
Neutro Abs: 3.5 10*3/uL (ref 1.4–7.7)
Neutrophils Relative %: 62.4 % (ref 43.0–77.0)
Platelets: 198 10*3/uL (ref 150.0–400.0)
RBC: 5.16 Mil/uL (ref 4.22–5.81)
RDW: 12.9 % (ref 11.5–15.5)
WBC: 5.6 10*3/uL (ref 4.0–10.5)

## 2022-07-04 LAB — COMPREHENSIVE METABOLIC PANEL
ALT: 47 U/L (ref 0–53)
AST: 26 U/L (ref 0–37)
Albumin: 4.6 g/dL (ref 3.5–5.2)
Alkaline Phosphatase: 61 U/L (ref 39–117)
BUN: 15 mg/dL (ref 6–23)
CO2: 30 mEq/L (ref 19–32)
Calcium: 10 mg/dL (ref 8.4–10.5)
Chloride: 101 mEq/L (ref 96–112)
Creatinine, Ser: 1.09 mg/dL (ref 0.40–1.50)
GFR: 82.66 mL/min (ref >60.00)
Glucose, Bld: 91 mg/dL (ref 70–99)
Potassium: 4.2 mEq/L (ref 3.5–5.1)
Sodium: 138 mEq/L (ref 135–145)
Total Bilirubin: 0.8 mg/dL (ref 0.2–1.2)
Total Protein: 7.5 g/dL (ref 6.0–8.3)

## 2022-07-04 LAB — LIPID PANEL
Cholesterol: 216 mg/dL — ABNORMAL HIGH (ref 0–200)
HDL: 49.2 mg/dL (ref >39.00)
LDL Cholesterol: 146 mg/dL — ABNORMAL HIGH (ref 0–99)
NonHDL: 166.92
Total CHOL/HDL Ratio: 4
Triglycerides: 106 mg/dL (ref 0.0–149.0)
VLDL: 21.2 mg/dL (ref 0.0–40.0)

## 2022-07-04 LAB — TSH: TSH: 0.69 u[IU]/mL (ref 0.35–5.50)

## 2022-08-18 ENCOUNTER — Other Ambulatory Visit: Payer: Self-pay | Admitting: Family

## 2022-08-18 DIAGNOSIS — I10 Essential (primary) hypertension: Secondary | ICD-10-CM

## 2022-08-29 ENCOUNTER — Encounter: Payer: Self-pay | Admitting: Family

## 2022-10-03 ENCOUNTER — Encounter: Payer: Self-pay | Admitting: Family

## 2022-10-17 ENCOUNTER — Ambulatory Visit: Payer: Managed Care, Other (non HMO) | Admitting: Family

## 2022-11-22 ENCOUNTER — Other Ambulatory Visit: Payer: Self-pay | Admitting: Family

## 2022-11-22 DIAGNOSIS — I10 Essential (primary) hypertension: Secondary | ICD-10-CM

## 2022-12-07 ENCOUNTER — Ambulatory Visit: Payer: Managed Care, Other (non HMO) | Admitting: Family

## 2022-12-07 ENCOUNTER — Encounter: Payer: Self-pay | Admitting: Family

## 2022-12-07 VITALS — BP 138/88 | HR 86 | Temp 97.8°F | Ht 71.0 in | Wt 217.2 lb

## 2022-12-07 DIAGNOSIS — I10 Essential (primary) hypertension: Secondary | ICD-10-CM

## 2022-12-07 DIAGNOSIS — E782 Mixed hyperlipidemia: Secondary | ICD-10-CM | POA: Diagnosis not present

## 2022-12-07 NOTE — Progress Notes (Signed)
Assessment & Plan:  Mixed hyperlipidemia -     Lipid panel; Future  Primary hypertension Assessment & Plan: Slightly elevated today.  We discussed weight gain and dietary discretion.  Discussed low glycemic diet, low-sodium diet.  Patient to monitor blood pressure at home and let me know if persistently greater than 130/80.  We discussed optimal goal for blood pressure was closer to 120/80.  Continue hydrochlorothiazide 12.5 mg daily, losartan 50 mg every day  Orders: -     Basic metabolic panel; Future     Return precautions given.   Risks, benefits, and alternatives of the medications and treatment plan prescribed today were discussed, and patient expressed understanding.   Education regarding symptom management and diagnosis given to patient on AVS either electronically or printed.  Return in about 3 months (around 03/09/2023).  Rennie Plowman, FNP  Subjective:    Patient ID: Carlos Long, male    DOB: Oct 29, 1980, 42 y.o.   MRN: [id number]  CC: Carlos Long is a 42 y.o. male who presents today for follow up.   HPI: Feels well today No new complaints  BP 130-140/ doesn't recall the bottom number.   Compliant with hydrochlorothiazide 12.5 mg daily,losartan 100 mg daily.  He does not add salt to foods.  He does endorse some dietary indiscretion of late.  Recently started working out with his son again in his home gym    Allergies: Amlodipine Current Outpatient Medications on File Prior to Visit  Medication Sig Dispense Refill   albuterol (PROVENTIL) (5 MG/ML) 0.5% nebulizer solution Take 0.5 mLs (2.5 mg total) by nebulization every 6 (six) hours as needed for wheezing or shortness of breath. 20 mL 2   albuterol (VENTOLIN HFA) 108 (90 Base) MCG/ACT inhaler Inhale 2 puffs into the lungs every 6 (six) hours as needed for wheezing or shortness of breath. 8 g 2   hydrochlorothiazide (MICROZIDE) 12.5 MG capsule TAKE 1 CAPSULE BY MOUTH EVERY DAY 90 capsule 2   losartan  (COZAAR) 50 MG tablet TAKE 2 TABLETS (100 MG TOTAL) BY MOUTH EVERY EVENING 180 tablet 1   meloxicam (MOBIC) 7.5 MG tablet TAKE 1 TABLET BY MOUTH EVERY DAY AS NEEDED FOR PAIN 30 tablet 2   omeprazole (PRILOSEC) 20 MG capsule Take by mouth.     No current facility-administered medications on file prior to visit.    Review of Systems  Constitutional:  Negative for chills and fever.  Respiratory:  Negative for cough.   Cardiovascular:  Negative for chest pain and palpitations.  Gastrointestinal:  Negative for nausea and vomiting.      Objective:    BP 138/88   Pulse 86   Temp 97.8 F (36.6 C) (Oral)   Ht 5\' 11"  (1.803 m)   Wt 217 lb 3.2 oz (98.5 kg)   SpO2 96%   BMI 30.29 kg/m  BP Readings from Last 3 Encounters:  12/07/22 138/88  06/14/22 118/70  04/18/22 136/78   Wt Readings from Last 3 Encounters:  12/07/22 217 lb 3.2 oz (98.5 kg)  06/14/22 205 lb 9.6 oz (93.3 kg)  04/18/22 212 lb (96.2 kg)    Physical Exam Vitals reviewed.  Constitutional:      Appearance: He is well-developed.  Cardiovascular:     Rate and Rhythm: Regular rhythm.     Heart sounds: Normal heart sounds.  Pulmonary:     Effort: Pulmonary effort is normal. No respiratory distress.     Breath sounds: Normal breath sounds. No wheezing, rhonchi or  rales.  Skin:    General: Skin is warm and dry.  Neurological:     Mental Status: He is alert.  Psychiatric:        Speech: Speech normal.        Behavior: Behavior normal.

## 2022-12-07 NOTE — Patient Instructions (Signed)
Please download Myfitness Pal App ( basic version is free).   You may log every thing you eat for even 2-3 days to get a better of idea of total daily calories. To loose weight, we have to create caloric deficit to loose weight. The goal is 1-2 lbs per week of weight loss.   Excellent article below from [facility].   https://[email or web address].it  Calorie counting made easy  Eat less, exercise more. If only it were that simple! As most dieters know, losing weight can be very challenging. As this report details, a range of influences can affect how people gain and lose weight. But a basic understanding of how to tip your energy balance in favor of weight loss is a good place to start.  Start by determining how many calories you should consume each day. To do so, you need to know how many calories you need to maintain your current weight. Doing this requires a few simple calculations.  First, multiply your current weight by 15 -- that's roughly the number of calories per pound of body weight needed to maintain your current weight if you are moderately active. Moderately active means getting at least 30 minutes of physical activity a day in the form of exercise (walking at a brisk pace, climbing stairs, or active gardening). Let's say you're a woman who is 5 feet, 4 inches tall and weighs 155 pounds, and you need to lose about 15 pounds to put you in a healthy weight range. If you multiply 155 by 15, you will get 2,325, which is the number of calories per day that you need in order to maintain your current weight (weight-maintenance calories). To lose weight, you will need to get below that total.  For example, to lose 1 to 2 pounds a week -- a rate that experts consider safe -- your food consumption should provide 500 to 1,000 calories less than your total weight-maintenance calories. If you need 2,325 calories a day to maintain your current weight,  reduce your daily calories to between 1,325 and 1,825. If you are sedentary, you will also need to build more activity into your day. In order to lose at least a pound a week, try to do at least 30 minutes of physical activity on most days, and reduce your daily calorie intake by at least 500 calories. However, calorie intake should not fall below 1,200 a day in women or 1,500 a day in men, except under the supervision of a health professional. Eating too few calories can endanger your health by depriving you of needed nutrients.  Meeting your calorie target How can you meet your daily calorie target? One approach is to add up the number of calories per serving of all the foods that you eat, and then plan your menus accordingly. You can buy books that list calories per serving for many foods. In addition, the nutrition labels on all packaged foods and beverages provide calories per serving information. Make a point of reading the labels of the foods and drinks you use, noting the number of calories and the serving sizes. Many recipes published in cookbooks, newspapers, and magazines provide similar information.  If you hate counting calories, a different approach is to restrict how much and how often you eat, and to eat meals that are low in calories. Dietary guidelines issued by the American Heart Association stress common sense in choosing your foods rather than focusing strictly on numbers, such as total calories or calories from fat.  Whichever method you choose, research shows that a regular eating schedule -- with meals and snacks planned for certain times each day -- makes for the most successful approach. The same applies after you have lost weight and want to keep it off. Sticking with an eating schedule increases your chance of maintaining your new weight.    This is  Dr. Melina Schools  ( an amazing physician in my office!)  example of a  "Low GI"  Diet:  It will allow you to lose 4 to 8  lbs  per month  if you follow it carefully.  Your goal with exercise is a minimum of 30 minutes of aerobic exercise 5 days per week (Walking does not count once it becomes easy!)    All of the foods can be found at grocery stores and in bulk at [employer].  The Atkins protein bars and shakes are available in more varieties at [employer], [employer] and [employer].     7 AM Breakfast:  Choose from the following:  Low carbohydrate Protein  Shakes (I recommend the  Premier Protein chocolate shakes,  EAS AdvantEdge "Carb Control" shakes  Or the Atkins shakes all are under 3 net carbs)     a scrambled egg/bacon/cheese burrito made with Mission's "carb balance" whole wheat tortilla  (about 10 net carbs )  Medical laboratory scientific officer (basically a quiche without the pastry crust) that is eaten cold and very convenient way to get your eggs.  8 carbs)  If you make your own protein shakes, avoid bananas and pineapple,  And use low carb greek yogurt or original /unsweetened almond or soy milk    Avoid cereal and bananas, oatmeal and cream of wheat and grits. They are loaded with carbohydrates!   10 AM: high protein snack:  Protein bar by Atkins (the snack size, under 200 cal, usually < 6 net carbs).    A stick of cheese:  Around 1 carb,  100 cal     Dannon Light n Fit [place] Yogurt  (80 cal, 8 carbs)  Other so called "protein bars" and Greek yogurts tend to be loaded with carbohydrates.  Remember, in food advertising, the word "energy" is synonymous for " carbohydrate."  Lunch:   A Sandwich using the bread choices listed, Can use any  Eggs,  lunchmeat, grilled meat or canned tuna), avocado, regular mayo/mustard  and cheese.  A Salad using blue cheese, ranch,  Goddess or vinagrette,  Avoid taco shells, croutons or "confetti" and no "candied nuts" but regular nuts OK.   No pretzels, nabs  or chips.  Pickles and miniature sweet peppers are a good low carb alternative that provide a "crunch"  The bread is the only  source of carbohydrate in a sandwich and  can be decreased by trying some of the attached alternatives to traditional loaf bread   Avoid "Low fat dressings, as well as Reyne Dumas and [employer] dressings They are loaded with sugar!   3 PM/ Mid day  Snack:  Consider  1 ounce of  almonds, walnuts, pistachios, pecans, peanuts,  Macadamia nuts or a nut medley.  Avoid "granola and granola bars "  Mixed nuts are ok in moderation as long as there are no raisins,  cranberries or dried fruit.   KIND bars are OK if you get the low glycemic index variety   Try the prosciutto/mozzarella cheese sticks by [employer]  In deli /backery section   High protein  6 PM  Dinner:     Meat/fowl/fish with a green salad, and either broccoli, cauliflower, green beans, spinach, brussel sprouts or  Lima beans. DO NOT BREAD THE PROTEIN!!      There is a low carb pasta by Dreamfield's that is acceptable and tastes great: only 5 digestible carbs/serving.( All grocery stores but BJs carry it ) Several ready made meals are available low carb:   Try Michel Angelo's chicken piccata or chicken or eggplant parm over low carb pasta.(Lowes and BJs)   Clifton Custard Sanchez's "Carnitas" (pulled pork, no sauce,  0 carbs) or his beef pot roast to make a dinner burrito (at [employer])  Pesto over low carb pasta (bj's sells a good quality pesto in the center refrigerated section of the deli   Try satueeing  Roosvelt Harps with mushroooms as a good side   [employer] makes a mashed cauliflower that tastes like mashed potatoes  Whole wheat pasta is still full of digestible carbs and  Not as low in glycemic index as Dreamfield's.   Brown rice is still rice,  So skip the rice and noodles if you eat [place] or [place] (or at least limit to 1/2 cup)  9 PM snack :   Breyer's "low carb" fudgsicle or  ice cream bar (Carb Smart line), or  Weight Watcher's ice cream bar , or another "no sugar added" ice cream;  a serving of fresh berries/cherries with whipped  cream   Cheese or DANNON'S LlGHT N FIT GREEK YOGURT  8 ounces of [place] unsweetened almond/cococunut milk    Treat yourself to a parfait made with whipped cream blueberiies, walnuts and vanilla greek yogurt  Avoid bananas, pineapple, grapes  and watermelon on a regular basis because they are high in sugar.  THINK OF THEM AS DESSERT  Remember that snack Substitutions should be less than 10 NET carbs per serving and meals < 20 carbs. Remember to subtract fiber grams to get the "net carbs."

## 2022-12-07 NOTE — Assessment & Plan Note (Signed)
Slightly elevated today.  We discussed weight gain and dietary discretion.  Discussed low glycemic diet, low-sodium diet.  Patient to monitor blood pressure at home and let me know if persistently greater than 130/80.  We discussed optimal goal for blood pressure was closer to 120/80.  Continue hydrochlorothiazide 12.5 mg daily, losartan 50 mg every day

## 2022-12-14 ENCOUNTER — Ambulatory Visit: Payer: Managed Care, Other (non HMO) | Admitting: Family

## 2022-12-17 ENCOUNTER — Ambulatory Visit: Payer: Managed Care, Other (non HMO) | Admitting: Family

## 2022-12-25 ENCOUNTER — Encounter: Payer: Self-pay | Admitting: Family

## 2023-01-16 ENCOUNTER — Encounter: Payer: Self-pay | Admitting: Family

## 2023-01-16 ENCOUNTER — Ambulatory Visit: Payer: Managed Care, Other (non HMO) | Admitting: Family

## 2023-01-16 VITALS — BP 133/79 | HR 50 | Temp 97.6°F | Ht 71.0 in | Wt 210.0 lb

## 2023-01-16 DIAGNOSIS — I10 Essential (primary) hypertension: Secondary | ICD-10-CM

## 2023-01-16 DIAGNOSIS — R1030 Lower abdominal pain, unspecified: Secondary | ICD-10-CM

## 2023-01-16 DIAGNOSIS — K5900 Constipation, unspecified: Secondary | ICD-10-CM

## 2023-01-16 LAB — CBC WITH DIFFERENTIAL/PLATELET
Basophils Absolute: 0.1 10*3/uL (ref 0.0–0.1)
Basophils Relative: 0.9 % (ref 0.0–3.0)
Eosinophils Absolute: 0.1 10*3/uL (ref 0.0–0.7)
Eosinophils Relative: 2.4 % (ref 0.0–5.0)
HCT: 44.6 % (ref 39.0–52.0)
Hemoglobin: 14.8 g/dL (ref 13.0–17.0)
Lymphocytes Relative: 29.3 % (ref 12.0–46.0)
Lymphs Abs: 1.7 10*3/uL (ref 0.7–4.0)
MCHC: 33.3 g/dL (ref 30.0–36.0)
MCV: 89.2 fL (ref 78.0–100.0)
Monocytes Absolute: 0.4 10*3/uL (ref 0.1–1.0)
Monocytes Relative: 6.2 % (ref 3.0–12.0)
Neutro Abs: 3.5 10*3/uL (ref 1.4–7.7)
Neutrophils Relative %: 61.2 % (ref 43.0–77.0)
Platelets: 233 10*3/uL (ref 150.0–400.0)
RBC: 5.01 Mil/uL (ref 4.22–5.81)
RDW: 12.5 % (ref 11.5–15.5)
WBC: 5.8 10*3/uL (ref 4.0–10.5)

## 2023-01-16 LAB — URINALYSIS, ROUTINE W REFLEX MICROSCOPIC
Bilirubin Urine: NEGATIVE
Hgb urine dipstick: NEGATIVE
Ketones, ur: NEGATIVE
Leukocytes,Ua: NEGATIVE
Nitrite: NEGATIVE
RBC / HPF: NONE SEEN (ref 0–?)
Specific Gravity, Urine: 1.025 (ref 1.000–1.030)
Total Protein, Urine: NEGATIVE
Urine Glucose: NEGATIVE
Urobilinogen, UA: 0.2 (ref 0.0–1.0)
WBC, UA: NONE SEEN (ref 0–?)
pH: 6.5 (ref 5.0–8.0)

## 2023-01-16 LAB — COMPREHENSIVE METABOLIC PANEL
ALT: 43 U/L (ref 0–53)
AST: 25 U/L (ref 0–37)
Albumin: 4.6 g/dL (ref 3.5–5.2)
Alkaline Phosphatase: 66 U/L (ref 39–117)
BUN: 23 mg/dL (ref 6–23)
CO2: 30 meq/L (ref 19–32)
Calcium: 10.1 mg/dL (ref 8.4–10.5)
Chloride: 103 meq/L (ref 96–112)
Creatinine, Ser: 1.11 mg/dL (ref 0.40–1.50)
GFR: 80.57 mL/min (ref >60.00)
Glucose, Bld: 96 mg/dL (ref 70–99)
Potassium: 4.2 meq/L (ref 3.5–5.1)
Sodium: 138 meq/L (ref 135–145)
Total Bilirubin: 0.6 mg/dL (ref 0.2–1.2)
Total Protein: 7.1 g/dL (ref 6.0–8.3)

## 2023-01-16 NOTE — Progress Notes (Signed)
Assessment & Plan:  Lower abdominal pain Assessment & Plan: Reassuring exam.  Afebrile and nontoxic in appearance.  Differentials discussed constipation, GERD.  He will be due for colonoscopy in 2 months time.  I have placed this referral.  Discussed starting Metamucil, MiraLAX and ensuring a soft normal bowel movement daily.  I also advised to start omeprazole OTC . pending celiac screen.  Close follow-up.  Orders: -     Urinalysis, Routine w reflex microscopic -     Urine Culture -     Ambulatory referral to Gastroenterology -     CBC with Differential/Platelet -     Comprehensive metabolic panel -     Celiac Disease Panel  Constipation, unspecified constipation type  Primary hypertension Assessment & Plan: Slightly elevated today however he has not taking blood pressure medications yet today. Patient to monitor blood pressure at home and let me know if persistently greater than 130/80.  We discussed optimal goal for blood pressure was closer to 120/80.  Continue hydrochlorothiazide 12.5 mg daily, losartan 100 mg every day      Return precautions given.   Risks, benefits, and alternatives of the medications and treatment plan prescribed today were discussed, and patient expressed understanding.   Education regarding symptom management and diagnosis given to patient on AVS either electronically or printed.  No follow-ups on file.  Rennie Plowman, FNP  Subjective:    Patient ID: Carlos Long, male    DOB: 1980-07-03, 42 y.o.   MRN: [id number]  CC: Carlos Long is a 42 y.o. male who presents today for an acute visit.    HPI: Complains of abdominal 'discomfort', episodic, 'ebbs and flows' since last flare of suspected diverticulitis in the summer.  Discomfort improves after bowel movement  More noticeable in the morning after breakfast and first hour of work.  He has a feeling of needing to defacate and he is unable to defacate.   Endorses constipation Denies F, V,  diarrhea,  trouble swallowing, dysuria, urinary frequency      XR Ab 06/07/22, treated with cipro and flagyl with resolution of symptoms.   H/o appendectomy  No h/o renal stone  No NSAIDS  No alcohol use  He has not taken blood pressure medication yet today.  At home blood pressure 133/79.   Allergies: Amlodipine Current Outpatient Medications on File Prior to Visit  Medication Sig Dispense Refill   albuterol (PROVENTIL) (5 MG/ML) 0.5% nebulizer solution Take 0.5 mLs (2.5 mg total) by nebulization every 6 (six) hours as needed for wheezing or shortness of breath. (Patient not taking: Reported on 01/16/2023) 20 mL 2   albuterol (VENTOLIN HFA) 108 (90 Base) MCG/ACT inhaler Inhale 2 puffs into the lungs every 6 (six) hours as needed for wheezing or shortness of breath. (Patient not taking: Reported on 01/16/2023) 8 g 2   hydrochlorothiazide (MICROZIDE) 12.5 MG capsule TAKE 1 CAPSULE BY MOUTH EVERY DAY 90 capsule 2   losartan (COZAAR) 50 MG tablet TAKE 2 TABLETS (100 MG TOTAL) BY MOUTH EVERY EVENING 180 tablet 1   meloxicam (MOBIC) 7.5 MG tablet TAKE 1 TABLET BY MOUTH EVERY DAY AS NEEDED FOR PAIN 30 tablet 2   omeprazole (PRILOSEC) 20 MG capsule Take by mouth.     No current facility-administered medications on file prior to visit.    Review of Systems  Constitutional:  Negative for chills and fever.  Respiratory:  Negative for cough.   Cardiovascular:  Negative for chest pain and palpitations.  Gastrointestinal:  Positive for abdominal pain and constipation. Negative for blood in stool, diarrhea, nausea and vomiting.      Objective:    BP 133/79   Pulse (!) 50   Temp 97.6 F (36.4 C)   Ht 5\' 11"  (1.803 m)   Wt 210 lb (95.3 kg)   SpO2 98%   BMI 29.29 kg/m   BP Readings from Last 3 Encounters:  01/16/23 133/79  12/07/22 138/88  06/14/22 118/70   Wt Readings from Last 3 Encounters:  01/16/23 210 lb (95.3 kg)  12/07/22 217 lb 3.2 oz (98.5 kg)  06/14/22 205 lb 9.6 oz  (93.3 kg)    Physical Exam Vitals reviewed.  Constitutional:      Appearance: Normal appearance. He is well-developed.  Cardiovascular:     Rate and Rhythm: Regular rhythm.     Heart sounds: Normal heart sounds.  Pulmonary:     Effort: Pulmonary effort is normal. No respiratory distress.     Breath sounds: Normal breath sounds. No wheezing or rales.  Abdominal:     General: Bowel sounds are normal. There is no distension.     Palpations: Abdomen is soft. Abdomen is not rigid. There is no fluid wave or mass.     Tenderness: There is no abdominal tenderness. There is no right CVA tenderness, left CVA tenderness, guarding or rebound. Negative signs include Murphy's sign and McBurney's sign.     Comments:  no guarding on exam.   Skin:    General: Skin is warm and dry.  Neurological:     Mental Status: He is alert.  Psychiatric:        Speech: Speech normal.        Behavior: Behavior normal.

## 2023-01-16 NOTE — Patient Instructions (Addendum)
I question if abdominal discomfort is related to underlying constipation.  I would start I think in the absence of focal abdominal pain which otherwise suggest infectious or acute etiology such as diverticulitis.    Start prilosec over-the-counter.  Please take 30 minutes to an hour prior to breakfast.   To bulk stool, you may either try Metamucil 3.4 g daily up to 3 times daily .  Or you may try Benefiber 1 tablespoon 3 times daily with meals.  Benefiber and Metamucil work in the same way. They absorb water from your intestines to form softer, bulkier stools. These stools flow more easily through your digestive system, which helps you have easier bowel movements. These supplements also increase how often you have bowel movements.  It is important to increase dietary fiber and water intake as well. .   If metamucil doesn't work, start miralax either every other day or every third day to further regulate your bowels.   Please let me know how you are doing

## 2023-01-17 LAB — URINE CULTURE
MICRO NUMBER:: 15787819
Result:: NO GROWTH
SPECIMEN QUALITY:: ADEQUATE

## 2023-01-18 LAB — CELIAC DISEASE PANEL
(tTG) Ab, IgA: <1 U/mL
(tTG) Ab, IgG: <1 U/mL
Gliadin IgA: <1 U/mL
Gliadin IgG: <1 U/mL
Immunoglobulin A: 134 mg/dL (ref 47–310)

## 2023-01-22 NOTE — Assessment & Plan Note (Addendum)
Slightly elevated today however he has not taking blood pressure medications yet today. Patient to monitor blood pressure at home and let me know if persistently greater than 130/80.  We discussed optimal goal for blood pressure was closer to 120/80.  Continue hydrochlorothiazide 12.5 mg daily, losartan 100 mg every day

## 2023-01-22 NOTE — Assessment & Plan Note (Addendum)
Reassuring exam.  Afebrile and nontoxic in appearance.  Differentials discussed constipation, GERD.  He will be due for colonoscopy in 2 months time.  I have placed this referral.  Discussed starting Metamucil, MiraLAX and ensuring a soft normal bowel movement daily.  I also advised to start omeprazole OTC . pending celiac screen.  Close follow-up.

## 2023-01-23 ENCOUNTER — Other Ambulatory Visit (INDEPENDENT_AMBULATORY_CARE_PROVIDER_SITE_OTHER): Payer: Managed Care, Other (non HMO)

## 2023-01-23 DIAGNOSIS — E782 Mixed hyperlipidemia: Secondary | ICD-10-CM | POA: Diagnosis not present

## 2023-01-23 DIAGNOSIS — I10 Essential (primary) hypertension: Secondary | ICD-10-CM

## 2023-01-23 LAB — LIPID PANEL
Cholesterol: 219 mg/dL — ABNORMAL HIGH (ref 0–200)
HDL: 42.7 mg/dL (ref >39.00)
LDL Cholesterol: 149 mg/dL — ABNORMAL HIGH (ref 0–99)
NonHDL: 176.57
Total CHOL/HDL Ratio: 5
Triglycerides: 139 mg/dL (ref 0.0–149.0)
VLDL: 27.8 mg/dL (ref 0.0–40.0)

## 2023-01-23 LAB — BASIC METABOLIC PANEL
BUN: 18 mg/dL (ref 6–23)
CO2: 31 meq/L (ref 19–32)
Calcium: 9.5 mg/dL (ref 8.4–10.5)
Chloride: 102 meq/L (ref 96–112)
Creatinine, Ser: 1.12 mg/dL (ref 0.40–1.50)
GFR: 79.7 mL/min (ref >60.00)
Glucose, Bld: 102 mg/dL — ABNORMAL HIGH (ref 70–99)
Potassium: 4.5 meq/L (ref 3.5–5.1)
Sodium: 138 meq/L (ref 135–145)

## 2023-02-24 ENCOUNTER — Other Ambulatory Visit: Payer: Self-pay | Admitting: Family

## 2023-02-24 DIAGNOSIS — I10 Essential (primary) hypertension: Secondary | ICD-10-CM

## 2023-03-11 ENCOUNTER — Ambulatory Visit: Payer: Managed Care, Other (non HMO) | Admitting: Family

## 2023-03-21 ENCOUNTER — Encounter: Payer: Self-pay | Admitting: Family

## 2023-03-21 ENCOUNTER — Ambulatory Visit: Payer: Managed Care, Other (non HMO) | Admitting: Family

## 2023-03-21 VITALS — BP 134/84 | HR 64 | Temp 98.0°F | Resp 18 | Ht 71.0 in | Wt 220.2 lb

## 2023-03-21 DIAGNOSIS — G8929 Other chronic pain: Secondary | ICD-10-CM

## 2023-03-21 DIAGNOSIS — I10 Essential (primary) hypertension: Secondary | ICD-10-CM | POA: Diagnosis not present

## 2023-03-21 DIAGNOSIS — M25521 Pain in right elbow: Secondary | ICD-10-CM

## 2023-03-21 DIAGNOSIS — M545 Low back pain, unspecified: Secondary | ICD-10-CM | POA: Diagnosis not present

## 2023-03-21 DIAGNOSIS — J45909 Unspecified asthma, uncomplicated: Secondary | ICD-10-CM | POA: Diagnosis not present

## 2023-03-21 MED ORDER — ALBUTEROL SULFATE HFA 108 (90 BASE) MCG/ACT IN AERS: 2.0000 | INHALATION_SPRAY | Freq: Four times a day (QID) | RESPIRATORY_TRACT | 2 refills | Status: DC | PRN

## 2023-03-21 MED ORDER — MELOXICAM 7.5 MG PO TABS: 7.5000 mg | ORAL_TABLET | Freq: Every day | ORAL | 2 refills | Status: DC | PRN

## 2023-03-21 NOTE — Patient Instructions (Addendum)
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   So that you are aware, if you are [facility] MyChart user , please pay attention to your MyChart messages as you may receive a MyChart message with a phone number to call and schedule this test/appointment own your own from our referral coordinator. This is a new process so I do not want you to miss this message.  If you are not a MyChart user, you will receive a phone call.    Low Back Sprain or Strain Rehab Ask your health care provider which exercises are safe for you. Do exercises exactly as told by your health care provider and adjust them as directed. It is normal to feel mild stretching, pulling, tightness, or discomfort as you do these exercises. Stop right away if you feel sudden pain or your pain gets worse. Do not begin these exercises until told by your health care provider. Stretching and range-of-motion exercises These exercises warm up your muscles and joints and improve the movement and flexibility of your back. These exercises also help to relieve pain, numbness, and tingling. Lumbar rotation  Lie on your back on a firm bed or the floor with your knees bent. Straighten your arms out to your sides so each arm forms a 90-degree angle (right angle) with a side of your body. Slowly move (rotate) both of your knees to one side of your body until you feel a stretch in your lower back (lumbar). Try not to let your shoulders lift off the floor. Hold this position for __________ seconds. Tense your abdominal muscles and slowly move your knees back to the starting position. Repeat this exercise on the other side of your body. Repeat __________ times. Complete this exercise __________ times a day. Single knee to  chest  Lie on your back on a firm bed or the floor with both legs straight. Bend one of your knees. Use your hands to move your knee up toward your chest until you feel a gentle stretch in your lower back and buttock. Hold your leg in this position by holding on to the front of your knee. Keep your other leg as straight as possible. Hold this position for __________ seconds. Slowly return to the starting position. Repeat with your other leg. Repeat __________ times. Complete this exercise __________ times a day. Prone extension on elbows  Lie on your abdomen on a firm bed or the floor (prone position). Prop yourself up on your elbows. Use your arms to help lift your chest up until you feel a gentle stretch in your abdomen and your lower back. This will place some of your body weight on your elbows. If this is uncomfortable, try stacking pillows under your chest. Your hips should stay down, against the surface that you are lying on. Keep your hip and back muscles relaxed. Hold this position for __________ seconds. Slowly relax your upper body and return to the starting position. Repeat __________ times. Complete this exercise __________ times a day. Strengthening exercises These exercises build strength and endurance in your back. Endurance is the ability to use your muscles for a long time, even after they get tired. Pelvic tilt This exercise strengthens the muscles that lie deep in the abdomen. Lie on  your back on a firm bed or the floor with your legs extended. Bend your knees so they are pointing toward the ceiling and your feet are flat on the floor. Tighten your lower abdominal muscles to press your lower back against the floor. This motion will tilt your pelvis so your tailbone points up toward the ceiling instead of pointing to your feet or the floor. To help with this exercise, you may place a small towel under your lower back and try to push your back into the towel. Hold this  position for __________ seconds. Let your muscles relax completely before you repeat this exercise. Repeat __________ times. Complete this exercise __________ times a day. Alternating arm and leg raises  Get on your hands and knees on a firm surface. If you are on a hard floor, you may want to use padding, such as an exercise mat, to cushion your knees. Line up your arms and legs. Your hands should be directly below your shoulders, and your knees should be directly below your hips. Lift your left leg behind you. At the same time, raise your right arm and straighten it in front of you. Do not lift your leg higher than your hip. Do not lift your arm higher than your shoulder. Keep your abdominal and back muscles tight. Keep your hips facing the ground. Do not arch your back. Keep your balance carefully, and do not hold your breath. Hold this position for __________ seconds. Slowly return to the starting position. Repeat with your right leg and your left arm. Repeat __________ times. Complete this exercise __________ times a day. Abdominal set with straight leg raise  Lie on your back on a firm bed or the floor. Bend one of your knees and keep your other leg straight. Tense your abdominal muscles and lift your straight leg up, 4-6 inches (10-15 cm) off the ground. Keep your abdominal muscles tight and hold this position for __________ seconds. Do not hold your breath. Do not arch your back. Keep it flat against the ground. Keep your abdominal muscles tense as you slowly lower your leg back to the starting position. Repeat with your other leg. Repeat __________ times. Complete this exercise __________ times a day. Single leg lower with bent knees Lie on your back on a firm bed or the floor. Tense your abdominal muscles and lift your feet off the floor, one foot at a time, so your knees and hips are bent in 90-degree angles (right angles). Your knees should be over your hips and your lower  legs should be parallel to the floor. Keeping your abdominal muscles tense and your knee bent, slowly lower one of your legs so your toe touches the ground. Lift your leg back up to return to the starting position. Do not hold your breath. Do not let your back arch. Keep your back flat against the ground. Repeat with your other leg. Repeat __________ times. Complete this exercise __________ times a day. Posture and body mechanics Good posture and healthy body mechanics can help to relieve stress in your body's tissues and joints. Body mechanics refers to the movements and positions of your body while you do your daily activities. Posture is part of body mechanics. Good posture means: Your spine is in its natural S-curve position (neutral). Your shoulders are pulled back slightly. Your head is not tipped forward (neutral). Follow these guidelines to improve your posture and body mechanics in your everyday activities. Standing  When standing, keep your spine  neutral and your feet about hip-width apart. Keep a slight bend in your knees. Your ears, shoulders, and hips should line up. When you do a task in which you stand in one place for a long time, place one foot up on a stable object that is 2-4 inches (5-10 cm) high, such as a footstool. This helps keep your spine neutral. Sitting  When sitting, keep your spine neutral and keep your feet flat on the floor. Use a footrest, if necessary, and keep your thighs parallel to the floor. Avoid rounding your shoulders, and avoid tilting your head forward. When working at a desk or a computer, keep your desk at a height where your hands are slightly lower than your elbows. Slide your chair under your desk so you are close enough to maintain good posture. When working at a computer, place your monitor at a height where you are looking straight ahead and you do not have to tilt your head forward or downward to look at the screen. Resting When lying down  and resting, avoid positions that are most painful for you. If you have pain with activities such as sitting, bending, stooping, or squatting, lie in a position in which your body does not bend very much. For example, avoid curling up on your side with your arms and knees near your chest (fetal position). If you have pain with activities such as standing for a long time or reaching with your arms, lie with your spine in a neutral position and bend your knees slightly. Try the following positions: Lying on your side with a pillow between your knees. Lying on your back with a pillow under your knees. Lifting  When lifting objects, keep your feet at least shoulder-width apart and tighten your abdominal muscles. Bend your knees and hips and keep your spine neutral. It is important to lift using the strength of your legs, not your back. Do not lock your knees straight out. Always ask for help to lift heavy or awkward objects. This information is not intended to replace advice given to you by your health care provider. Make sure you discuss any questions you have with your health care provider. Document Revised: 06/11/2022 Document Reviewed: 04/25/2020 Elsevier Patient Education  2024 [employer]  Hip Bursitis Rehab Ask your health care provider which exercises are safe for you. Do exercises exactly as told by your health care provider and adjust them as directed. It is normal to feel mild stretching, pulling, tightness, or discomfort as you do these exercises. Stop right away if you feel sudden pain or your pain gets worse. Do not begin these exercises until told by your health care provider. Stretching exercise This exercise warms up your muscles and joints and improves the movement and flexibility of your hip. This exercise also helps to relieve pain and stiffness. Iliotibial band stretch An iliotibial band is a strong band of muscle tissue that runs from the outer side of your hip to the outer  side of your thigh and knee. Lie on your side with your left / right leg in the top position. Bend your left / right knee and grab your ankle. Stretch out your bottom arm to help you balance. Slowly bring your knee back so your thigh is slightly behind your body. Slowly lower your knee toward the floor until you feel a gentle stretch on the outside of your left / right thigh. If you do not feel a stretch and your knee will not lower  more toward the floor, place the heel of your other foot on top of your knee and pull your knee down toward the floor with your foot. Hold this position for __________ seconds. Slowly return to the starting position. Repeat __________ times. Complete this exercise __________ times a day. Strengthening exercises These exercises build strength and endurance in your hip and pelvis. Endurance is the ability to use your muscles for a long time, even after they get tired. Bridge This exercise strengthens the muscles that move your thigh backward (hip extensors). Lie on your back on a firm surface with your knees bent and your feet flat on the floor. Tighten your buttocks muscles and lift your buttocks off the floor until your trunk is level with your thighs. Do not arch your back. You should feel the muscles working in your buttocks and the back of your thighs. If you do not feel these muscles, slide your feet 1-2 inches (2.5-5 cm) farther away from your buttocks. If this exercise is too easy, try doing it with your arms crossed over your chest. Hold this position for __________ seconds. Slowly lower your hips to the starting position. Let your muscles relax completely after each repetition. Repeat __________ times. Complete this exercise __________ times a day. Squats This exercise strengthens the muscles in front of your thigh and knee (quadriceps). Stand in front of a table, with your feet and knees pointing straight ahead. You may rest your hands on the table for  balance but not for support. Slowly bend your knees and lower your hips like you are going to sit in a chair. Keep your weight over your heels, not over your toes. Keep your lower legs upright so they are parallel with the table legs. Do not let your hips go lower than your knees. Do not bend lower than told by your health care provider. If your hip pain increases, do not bend as low. Hold the squat position for __________ seconds. Slowly push with your legs to return to standing. Do not use your hands to pull yourself to standing. Repeat __________ times. Complete this exercise __________ times a day. Hip hike  Stand sideways on a bottom step. Stand on your left / right leg with your other foot unsupported next to the step. You can hold on to the railing or wall for balance if needed. Keep your knees straight and your torso square. Then lift your left / right hip up toward the ceiling. Hold this position for __________ seconds. Slowly let your left / right hip lower toward the floor, past the starting position. Your foot should get closer to the floor. Do not lean or bend your knees. Repeat __________ times. Complete this exercise __________ times a day. Single leg stand This exercise increases your balance. Without shoes, stand near a railing or in a doorway. You may hold on to the railing or door frame as needed for balance. Squeeze your left / right buttock muscles, then lift up your other foot. Do not let your left / right hip push out to the side. It is helpful to stand in front of a mirror for this exercise so you can watch your hip. Hold this position for __________ seconds. Repeat __________ times. Complete this exercise __________ times a day. This information is not intended to replace advice given to you by your health care provider. Make sure you discuss any questions you have with your health care provider. Document Revised: 01/18/2021 Document Reviewed: 01/18/2021 Elsevier  Patient Education  2024 [employer].

## 2023-03-21 NOTE — Progress Notes (Signed)
Assessment & Plan:  Chronic midline low back pain without sciatica Assessment & Plan: Acute on chronic. Discussed known h/o DDD. Discussed sacroiliac joint dysfunction, and trochanteric bursitis.  We agreed physical therapy is appropriate.  Refilled meloxicam for him to take.  He will continue omeprazole to prevent gastritis.  Orders: -     Meloxicam; Take 1 tablet (7.5 mg total) by mouth daily as needed for pain.  Dispense: 30 tablet; Refill: 2 -     Ambulatory referral to Physical Therapy  Mild asthma without complication, unspecified whether persistent -     Albuterol Sulfate HFA; Inhale 2 puffs into the lungs every 6 (six) hours as needed for wheezing or shortness of breath.  Dispense: 8 g; Refill: 2  Right elbow pain  Primary hypertension Assessment & Plan: Chronic, stable.  Discussed goal < 130/80. Patient declines escalation of regimen at this time. Continue hydrochlorothiazide 12.5 mg daily, losartan 100 mg every day      Return precautions given.   Risks, benefits, and alternatives of the medications and treatment plan prescribed today were discussed, and patient expressed understanding.   Education regarding symptom management and diagnosis given to patient on AVS either electronically or printed.  Return in about 6 months (around 09/18/2023).  Rennie Plowman, FNP  Subjective:    Patient ID: Brion Sossamon, male    DOB: 09-07-1980, 43 y.o.   MRN: [id number]  CC: Giovani Neumeister is a 43 y.o. male who presents today for follow up.   HPI: Complains of low back pain  for over a year Central to right sided low back pain.  Pain radiates down right lateral thigh. Denies numbness, urinary or fecal incontinence.   Worse after sitting    Denies saddle anesthesia, urinary or fecal in thecontinuence  BP at  home 133/77  Denies CP, SOB    Abdominal pain has resolved.  Upcoming appointment with Dr. Allegra Lai next month  Lumbar x-ray 02/23/2022 degenerative disc disease  L4 to L5  Allergies: Amlodipine Current Outpatient Medications on File Prior to Visit  Medication Sig Dispense Refill   albuterol (PROVENTIL) (5 MG/ML) 0.5% nebulizer solution Take 0.5 mLs (2.5 mg total) by nebulization every 6 (six) hours as needed for wheezing or shortness of breath. 20 mL 2   hydrochlorothiazide (MICROZIDE) 12.5 MG capsule TAKE 1 CAPSULE BY MOUTH EVERY DAY 90 capsule 2   losartan (COZAAR) 50 MG tablet TAKE 2 TABLETS (100 MG TOTAL) BY MOUTH EVERY EVENING 180 tablet 1   omeprazole (PRILOSEC) 20 MG capsule Take by mouth.     No current facility-administered medications on file prior to visit.    Review of Systems  Constitutional:  Negative for chills and fever.  Respiratory:  Negative for cough.   Cardiovascular:  Negative for chest pain and palpitations.  Gastrointestinal:  Negative for nausea and vomiting.  Genitourinary:  Negative for difficulty urinating.  Musculoskeletal:  Positive for back pain.  Neurological:  Negative for numbness.      Objective:    BP 134/84   Pulse 64   Temp 98 F (36.7 C)   Resp 18   Ht 5\' 11"  (1.803 m)   Wt 220 lb 4 oz (99.9 kg)   SpO2 97%   BMI 30.72 kg/m  BP Readings from Last 3 Encounters:  03/21/23 134/84  01/16/23 133/79  12/07/22 138/88   Wt Readings from Last 3 Encounters:  03/21/23 220 lb 4 oz (99.9 kg)  01/16/23 210 lb (95.3 kg)  12/07/22 217 lb  3.2 oz (98.5 kg)    Physical Exam Vitals reviewed.  Constitutional:      Appearance: He is well-developed.  Cardiovascular:     Rate and Rhythm: Regular rhythm.     Heart sounds: Normal heart sounds.  Pulmonary:     Effort: Pulmonary effort is normal. No respiratory distress.     Breath sounds: Normal breath sounds. No wheezing, rhonchi or rales.  Musculoskeletal:     Lumbar back: No swelling, spasms, tenderness or bony tenderness. Normal range of motion. Negative right straight leg raise test and negative left straight leg raise test.       Back:     Comments:  Full range of motion with flexion, extension, lateral side bends. No pain, numbness, tingling elicited with single leg raise bilaterally. No rash.  Pain over right SI joint  Bilateral Hips: No limp or waddling gait. Full ROM with flexion and hip rotation in flexion.    No pain of lateral hip with  (flexion-abduction-external rotation) test.   Not exquisite pain of deep palpation of greater trochanter.     Skin:    General: Skin is warm and dry.  Neurological:     Mental Status: He is alert.  Psychiatric:        Speech: Speech normal.        Behavior: Behavior normal.

## 2023-03-21 NOTE — Assessment & Plan Note (Addendum)
Chronic, stable.  Discussed goal < 130/80. Patient declines escalation of regimen at this time. Continue hydrochlorothiazide 12.5 mg daily, losartan 100 mg every day

## 2023-03-21 NOTE — Assessment & Plan Note (Addendum)
Acute on chronic. Discussed known h/o DDD. Discussed sacroiliac joint dysfunction, and trochanteric bursitis.  We agreed physical therapy is appropriate.  Refilled meloxicam for him to take.  He will continue omeprazole to prevent gastritis.

## 2023-04-03 ENCOUNTER — Other Ambulatory Visit: Payer: Self-pay

## 2023-04-03 ENCOUNTER — Encounter: Payer: Self-pay | Admitting: Gastroenterology

## 2023-04-03 ENCOUNTER — Ambulatory Visit (INDEPENDENT_AMBULATORY_CARE_PROVIDER_SITE_OTHER): Payer: Managed Care, Other (non HMO) | Admitting: Gastroenterology

## 2023-04-03 VITALS — BP 159/104 | HR 60 | Temp 98.0°F | Ht 71.0 in | Wt 219.0 lb

## 2023-04-03 DIAGNOSIS — K5909 Other constipation: Secondary | ICD-10-CM

## 2023-04-03 DIAGNOSIS — Z1211 Encounter for screening for malignant neoplasm of colon: Secondary | ICD-10-CM

## 2023-04-03 DIAGNOSIS — R1030 Lower abdominal pain, unspecified: Secondary | ICD-10-CM

## 2023-04-03 MED ORDER — NA SULFATE-K SULFATE-MG SULF 17.5-3.13-1.6 GM/177ML PO SOLN: 354.0000 mL | Freq: Once | ORAL | 0 refills | Status: AC

## 2023-04-03 NOTE — Progress Notes (Signed)
Carlos Repress, MD [address]  [address]  [place], [place] [address]  Main: [phone]  Fax: [phone]    Gastroenterology Consultation  Referring Provider:     Allegra Grana, FNP Primary Care Physician:  Allegra Grana, FNP Primary Gastroenterologist:  Dr. Arlyss Long Reason for Consultation: Lower abdominal pain        HPI:   Carlos Long is a 43 y.o. male referred by Allegra Grana, FNP  for consultation & management of lower abdominal pain.  Patient reports that he had a severe episode of lower abdominal pain sometime in May 2024, he was told that he had diverticulitis and treated with antibiotics.  Since then intensity of pain has improved but still has intermittent lower abdominal pain, not as severe.  He also reports chronic constipation which has worsened sometime in October/November 2024.  He took MiraLAX daily for 2 weeks with significantly improved his bowel movements.  He then switched to about 2-3 times a week and noticed that his bowel movements are not as regular.  He denies any rectal bleeding. Consumes red meat about twice a week  He denies any family history of GI malignancy  NSAIDs: None  Antiplts/Anticoagulants/Anti thrombotics: None  GI Procedures: None  Past Medical History:  Diagnosis Date   Asthma    Frequent headaches    Hypertension     Past Surgical History:  Procedure Laterality Date   APPENDECTOMY       Current Outpatient Medications:    albuterol (PROVENTIL) (5 MG/ML) 0.5% nebulizer solution, Take 0.5 mLs (2.5 mg total) by nebulization every 6 (six) hours as needed for wheezing or shortness of breath., Disp: 20 mL, Rfl: 2   hydrochlorothiazide (MICROZIDE) 12.5 MG capsule, TAKE 1 CAPSULE BY MOUTH EVERY DAY, Disp: 90 capsule, Rfl: 2   losartan (COZAAR) 50 MG tablet, TAKE 2 TABLETS (100 MG TOTAL) BY MOUTH EVERY EVENING, Disp: 180 tablet, Rfl: 1   meloxicam (MOBIC) 7.5 MG tablet, Take 1 tablet (7.5 mg total) by  mouth daily as needed for pain., Disp: 30 tablet, Rfl: 2   Na Sulfate-K Sulfate-Mg Sulfate concentrate 17.5-3.13-1.6 GM/177ML SOLN, Take 354 mLs by mouth once for 1 dose., Disp: 354 mL, Rfl: 0   omeprazole (PRILOSEC) 20 MG capsule, Take by mouth., Disp: , Rfl:    Family History  Problem Relation Age of Onset   Diabetes Mother    Arthritis Father    Cancer Father 23       prostate   Hyperlipidemia Father    Hypertension Father    Stroke Father 90   Heart disease Father 51       CABG x 3   Heart disease Paternal Grandmother    Hypertension Paternal Grandmother    Stroke Paternal Grandmother    Pancreatic cancer Paternal Grandmother    Alcohol abuse Paternal Grandfather    Heart disease Paternal Grandfather    Hypertension Paternal Grandfather    Stroke Paternal Grandfather    Alcohol abuse Paternal Uncle    Cancer Paternal Uncle        prostate   Cancer Maternal Uncle 60       some type of abdominal cancer   Colon cancer Neg Hx      Social History   Tobacco Use   Smoking status: Never   Smokeless tobacco: Never  Vaping Use   Vaping status: Never Used  Substance Use Topics   Alcohol use: Yes    Alcohol/week:  0.0 standard drinks of alcohol   Drug use: No    Allergies as of 04/03/2023 - Review Complete 03/21/2023  Allergen Reaction Noted   Amlodipine  11/27/2019    Review of Systems:    All systems reviewed and negative except where noted in HPI.   Physical Exam:  BP (!) 159/104 (BP Location: Left Arm, Patient Position: Sitting, Cuff Size: Normal)   Pulse 60   Temp 98 F (36.7 C) (Oral)   Ht 5\' 11"  (1.803 m)   Wt 219 lb (99.3 kg)   BMI 30.54 kg/m  No LMP for male patient.  General:   Alert,  Well-developed, well-nourished, pleasant and cooperative in NAD Head:  Normocephalic and atraumatic. Eyes:  Sclera clear, no icterus.   Conjunctiva pink. Ears:  Normal auditory acuity. Nose:  No deformity, discharge, or lesions. Mouth:  No deformity or  lesions,oropharynx pink & moist. Neck:  Supple; no masses or thyromegaly. Lungs:  Respirations even and unlabored.  Clear throughout to auscultation.   No wheezes, crackles, or rhonchi. No acute distress. Heart:  Regular rate and rhythm; no murmurs, clicks, rubs, or gallops. Abdomen:  Normal bowel sounds. Soft, non-tender and non-distended without masses, hepatosplenomegaly or hernias noted.  No guarding or rebound tenderness.   Rectal: Not performed Msk:  Symmetrical without gross deformities. Good, equal movement & strength bilaterally. Pulses:  Normal pulses noted. Extremities:  No clubbing or edema.  No cyanosis. Neurologic:  Alert and oriented x3;  grossly normal neurologically. Skin:  Intact without significant lesions or rashes. No jaundice. Psych:  Alert and cooperative. Normal mood and affect.  Imaging Studies: No abdominal imaging  Assessment and Plan:   Carlos Long is a 43 y.o. male with history of hypertension is seen in consultation for chronic lower abdominal pain and constipation  Chronic constipation with lower abdominal pain Discussed about high-fiber diet, adequate intake of water Discussed about healthy eating habits, physical activity Cut back on consumption of red meat Continue MiraLAX 1 capful with large cup of water every day  Colon cancer screening Recommend screening colonoscopy with 2-day prep and patient is agreeable to undergo  I have discussed alternative options, risks & benefits,  which include, but are not limited to, bleeding, infection, perforation,respiratory complication & drug reaction.  The patient agrees with this plan & written consent will be obtained.     Follow up based on the above workup   Carlos Repress, MD

## 2023-04-18 ENCOUNTER — Ambulatory Visit: Payer: Managed Care, Other (non HMO) | Admitting: Anesthesiology

## 2023-04-18 ENCOUNTER — Other Ambulatory Visit: Payer: Self-pay

## 2023-04-18 ENCOUNTER — Encounter: Payer: Self-pay | Admitting: Gastroenterology

## 2023-04-18 ENCOUNTER — Encounter
Admission: RE | Disposition: A | Discharge status: Home / Self Care | Payer: Self-pay | Source: Home / Self Care | Attending: Gastroenterology

## 2023-04-18 ENCOUNTER — Ambulatory Visit
Admission: RE | Admit: 2023-04-18 | Discharge: 2023-04-18 | Disposition: A | Discharge status: Home / Self Care | Payer: Managed Care, Other (non HMO) | Attending: Gastroenterology | Admitting: Gastroenterology

## 2023-04-18 DIAGNOSIS — Z1211 Encounter for screening for malignant neoplasm of colon: Secondary | ICD-10-CM | POA: Diagnosis not present

## 2023-04-18 DIAGNOSIS — G4730 Sleep apnea, unspecified: Secondary | ICD-10-CM | POA: Diagnosis not present

## 2023-04-18 DIAGNOSIS — K5730 Diverticulosis of large intestine without perforation or abscess without bleeding: Secondary | ICD-10-CM | POA: Diagnosis not present

## 2023-04-18 DIAGNOSIS — I10 Essential (primary) hypertension: Secondary | ICD-10-CM | POA: Insufficient documentation

## 2023-04-18 DIAGNOSIS — Z79899 Other long term (current) drug therapy: Secondary | ICD-10-CM | POA: Diagnosis not present

## 2023-04-18 DIAGNOSIS — J45909 Unspecified asthma, uncomplicated: Secondary | ICD-10-CM | POA: Insufficient documentation

## 2023-04-18 HISTORY — PX: COLONOSCOPY WITH PROPOFOL: SHX5780

## 2023-04-18 SURGERY — COLONOSCOPY WITH PROPOFOL
Anesthesia: General

## 2023-04-18 MED ORDER — LIDOCAINE HCL (CARDIAC) PF 100 MG/5ML IV SOSY
PREFILLED_SYRINGE | INTRAVENOUS | Status: DC | PRN
  Administered 2023-04-18: 80 mg via INTRAVENOUS

## 2023-04-18 MED ORDER — PROPOFOL 10 MG/ML IV BOLUS
INTRAVENOUS | Status: AC
  Filled 2023-04-18: qty 40

## 2023-04-18 MED ORDER — PROPOFOL 500 MG/50ML IV EMUL
INTRAVENOUS | Status: DC | PRN
  Administered 2023-04-18: 150 ug/kg/min via INTRAVENOUS

## 2023-04-18 MED ORDER — DEXMEDETOMIDINE HCL IN NACL 80 MCG/20ML IV SOLN
INTRAVENOUS | Status: DC | PRN
  Administered 2023-04-18: 8 ug via INTRAVENOUS

## 2023-04-18 MED ORDER — LIDOCAINE HCL (PF) 2 % IJ SOLN
INTRAMUSCULAR | Status: AC
  Filled 2023-04-18: qty 5

## 2023-04-18 MED ORDER — SODIUM CHLORIDE 0.9 % IV SOLN: INTRAVENOUS | Status: DC

## 2023-04-18 MED ORDER — GLYCOPYRROLATE 0.2 MG/ML IJ SOLN
INTRAMUSCULAR | Status: AC
  Filled 2023-04-18: qty 1

## 2023-04-18 MED ORDER — DEXMEDETOMIDINE HCL IN NACL 80 MCG/20ML IV SOLN
INTRAVENOUS | Status: AC
  Filled 2023-04-18: qty 20

## 2023-04-18 MED ORDER — GLYCOPYRROLATE 0.2 MG/ML IJ SOLN
INTRAMUSCULAR | Status: DC | PRN
  Administered 2023-04-18: .2 mg via INTRAVENOUS

## 2023-04-18 MED ORDER — PROPOFOL 10 MG/ML IV BOLUS
INTRAVENOUS | Status: DC | PRN
  Administered 2023-04-18 (×2): 50 mg via INTRAVENOUS
  Administered 2023-04-18: 100 mg via INTRAVENOUS

## 2023-04-18 NOTE — H&P (Signed)
 Arlyss Repress, MD [address]  [address]  [place], [place] [address]  Main: [phone]  Fax: [phone] Pager: [phone]  Primary Care Physician:  Allegra Grana, FNP Primary Gastroenterologist:  Dr. Arlyss Repress  Pre-Procedure History & Physical: HPI:  Carlos Long is a 43 y.o. male is here for an colonoscopy.   Past Medical History:  Diagnosis Date   Asthma    Frequent headaches    Hypertension     Past Surgical History:  Procedure Laterality Date   APPENDECTOMY      Prior to Admission medications   Medication Sig Start Date End Date Taking? Authorizing Provider  albuterol (PROVENTIL) (5 MG/ML) 0.5% nebulizer solution Take 0.5 mLs (2.5 mg total) by nebulization every 6 (six) hours as needed for wheezing or shortness of breath. 05/30/20   Allegra Grana, FNP  hydrochlorothiazide (MICROZIDE) 12.5 MG capsule TAKE 1 CAPSULE BY MOUTH EVERY DAY 11/22/22   Allegra Grana, FNP  losartan (COZAAR) 50 MG tablet TAKE 2 TABLETS (100 MG TOTAL) BY MOUTH EVERY EVENING 02/25/23   Allegra Grana, FNP  meloxicam (MOBIC) 7.5 MG tablet Take 1 tablet (7.5 mg total) by mouth daily as needed for pain. 03/21/23   Allegra Grana, FNP  omeprazole (PRILOSEC) 20 MG capsule Take by mouth. 06/28/17 04/03/23  [provider]    Allergies as of 04/03/2023 - Review Complete 03/21/2023  Allergen Reaction Noted   Amlodipine  11/27/2019    Family History  Problem Relation Age of Onset   Diabetes Mother    Arthritis Father    Cancer Father 37       prostate   Hyperlipidemia Father    Hypertension Father    Stroke Father 45   Heart disease Father 2       CABG x 3   Heart disease Paternal Grandmother    Hypertension Paternal Grandmother    Stroke Paternal Grandmother    Pancreatic cancer Paternal Grandmother    Alcohol abuse Paternal Grandfather    Heart disease Paternal Grandfather    Hypertension Paternal Grandfather    Stroke Paternal Grandfather     Alcohol abuse Paternal Uncle    Cancer Paternal Uncle        prostate   Cancer Maternal Uncle 60       some type of abdominal cancer   Colon cancer Neg Hx     Social History   Socioeconomic History   Marital status: Married    Spouse name: Not on file   Number of children: Not on file   Years of education: Not on file   Highest education level: Some college, no degree  Occupational History   Not on file  Tobacco Use   Smoking status: Never   Smokeless tobacco: Never  Vaping Use   Vaping status: Never Used  Substance and Sexual Activity   Alcohol use: Yes    Alcohol/week: 0.0 standard drinks of alcohol   Drug use: No   Sexual activity: Yes    Partners: Female  Other Topics Concern   Not on file  Social History Narrative   Married   Works as Scientist, clinical (histocompatibility and immunogenetics)   Social Drivers of Corporate investment banker Strain: Low Risk  (01/12/2023)   Overall Financial Resource Strain (CARDIA)    Difficulty of Paying Living Expenses: Not hard at all  Food Insecurity: No Food Insecurity (01/12/2023)   Hunger Vital Sign    Worried About Programme researcher, broadcasting/film/video  in the Last Year: Never true    Ran Out of Food in the Last Year: Never true  Transportation Needs: No Transportation Needs (01/12/2023)   PRAPARE - Administrator, Civil Service (Medical): No    Lack of Transportation (Non-Medical): No  Physical Activity: Inactive (01/12/2023)   Exercise Vital Sign    Days of Exercise per Week: 1 day    Minutes of Exercise per Session: 0 min  Stress: No Stress Concern Present (01/12/2023)   [employer] of Occupational Health - Occupational Stress Questionnaire    Feeling of Stress : Only a little  Social Connections: Socially Integrated (01/12/2023)   Social Connection and Isolation Panel [NHANES]    Frequency of Communication with Friends and Family: More than three times a week    Frequency of Social Gatherings with Friends and Family: Twice a week    Attends  Religious Services: More than 4 times per year    Active Member of [employer] or Organizations: Yes    Attends Engineer, structural: More than 4 times per year    Marital Status: Married  Catering manager Violence: Not on file    Review of Systems: See HPI, otherwise negative ROS  Physical Exam: BP (!) 141/92   Pulse 66   Temp 97.9 F (36.6 C) (Temporal)   Resp 18   Ht 5\' 11"  (1.803 m)   Wt 93.9 kg   SpO2 100%   BMI 28.87 kg/m  General:   Alert,  pleasant and cooperative in NAD Head:  Normocephalic and atraumatic. Neck:  Supple; no masses or thyromegaly. Lungs:  Clear throughout to auscultation.    Heart:  Regular rate and rhythm. Abdomen:  Soft, nontender and nondistended. Normal bowel sounds, without guarding, and without rebound.   Neurologic:  Alert and  oriented x4;  grossly normal neurologically.  Impression/Plan: Carlos Long is here for an colonoscopy to be performed for colon cancer screening  Risks, benefits, limitations, and alternatives regarding  colonoscopy have been reviewed with the patient.  Questions have been answered.  All parties agreeable.   Lannette Donath, MD  04/18/2023, 9:28 AM

## 2023-04-18 NOTE — Anesthesia Postprocedure Evaluation (Signed)
 Anesthesia Post Note  Patient: Carlos Long  Procedure(s) Performed: COLONOSCOPY WITH PROPOFOL  Patient location during evaluation: PACU Anesthesia Type: General Level of consciousness: awake Pain management: satisfactory to patient Vital Signs Assessment: post-procedure vital signs reviewed and stable Respiratory status: spontaneous breathing Cardiovascular status: stable Anesthetic complications: no   No notable events documented.   Last Vitals:  Vitals:   04/18/23 1020 04/18/23 1028  BP: 121/80 120/84  Pulse: (!) 58   Resp: 13 18  Temp:    SpO2: 100%     Last Pain:  Vitals:   04/18/23 1010  TempSrc: Temporal  PainSc:                  VAN STAVEREN,Oreatha Fabry

## 2023-04-18 NOTE — Anesthesia Preprocedure Evaluation (Signed)
 Anesthesia Evaluation  Patient identified by MRN, date of birth, ID band Patient awake    Reviewed: Allergy & Precautions, NPO status , Patient's Chart, lab work & pertinent test results  Airway Mallampati: II  TM Distance: >3 FB Neck ROM: Full    Dental  (+) Teeth Intact   Pulmonary neg pulmonary ROS, asthma , sleep apnea    Pulmonary exam normal breath sounds clear to auscultation       Cardiovascular Exercise Tolerance: Good hypertension, Pt. on medications negative cardio ROS Normal cardiovascular exam Rhythm:Regular Rate:Normal     Neuro/Psych  Headaches  Anxiety     negative neurological ROS  negative psych ROS   GI/Hepatic negative GI ROS, Neg liver ROS,GERD  ,,  Endo/Other  negative endocrine ROS    Renal/GU negative Renal ROS  negative genitourinary   Musculoskeletal   Abdominal   Peds negative pediatric ROS (+)  Hematology negative hematology ROS (+)   Anesthesia Other Findings Past Medical History: No date: Asthma No date: Frequent headaches No date: Hypertension  Past Surgical History: No date: APPENDECTOMY  BMI    Body Mass Index: 28.87 kg/m      Reproductive/Obstetrics negative OB ROS                             Anesthesia Physical Anesthesia Plan  ASA: 2  Anesthesia Plan: General   Post-op Pain Management:    Induction: Intravenous  PONV Risk Score and Plan: Propofol infusion and TIVA  Airway Management Planned: Natural Airway and Nasal Cannula  Additional Equipment:   Intra-op Plan:   Post-operative Plan:   Informed Consent: I have reviewed the patients History and Physical, chart, labs and discussed the procedure including the risks, benefits and alternatives for the proposed anesthesia with the patient or authorized representative who has indicated his/her understanding and acceptance.     Dental Advisory Given  Plan Discussed with:  CRNA  Anesthesia Plan Comments:        Anesthesia Quick Evaluation

## 2023-04-18 NOTE — Transfer of Care (Signed)
 Immediate Anesthesia Transfer of Care Note  Patient: Carlos Long  Procedure(s) Performed: COLONOSCOPY WITH PROPOFOL  Patient Location: PACU  Anesthesia Type:MAC  Level of Consciousness: awake, alert , and oriented  Airway & Oxygen Therapy: Patient Spontanous Breathing  Post-op Assessment: Report given to RN and Post -op Vital signs reviewed and stable  Post vital signs: stable  Last Vitals:  Vitals Value Taken Time  BP 136/84 04/18/23 1010  Temp    Pulse 80 04/18/23 1010  Resp 29 04/18/23 1010  SpO2 100 % 04/18/23 1010    Last Pain:  Vitals:   04/18/23 0903  TempSrc: Temporal  PainSc: 0-No pain         Complications: No notable events documented.

## 2023-04-18 NOTE — Op Note (Signed)
 [facility] Gastroenterology Patient Name: Carlos Long Procedure Date: 04/18/2023 9:24 AM MRN: [id number] Account #: [id number] Date of Birth: [birth year] Admit Type: Outpatient Age: 43 Room: [facility] ENDO ROOM 3 Gender: Male Note Status: Finalized Instrument Name: Peds Colonoscope [id number] Procedure:             Colonoscopy Indications:           Screening for colorectal malignant neoplasm, This is                         the patient's first colonoscopy Providers:             Toney Reil MD, MD Referring MD:          Lyn Records. Arnett (Referring MD) Medicines:             General Anesthesia Complications:         No immediate complications. Estimated blood loss: None. Procedure:             Pre-Anesthesia Assessment:                        - Prior to the procedure, a History and Physical was                         performed, and patient medications and allergies were                         reviewed. The patient is competent. The risks and                         benefits of the procedure and the sedation options and                         risks were discussed with the patient. All questions                         were answered and informed consent was obtained.                         Patient identification and proposed procedure were                         verified by the physician, the nurse, the                         anesthesiologist, the anesthetist and the technician                         in the pre-procedure area in the procedure room in the                         endoscopy suite. Mental Status Examination: alert and                         oriented. Airway Examination: normal oropharyngeal                         airway and neck mobility. Respiratory Examination:  clear to auscultation. CV Examination: normal.                         Prophylactic Antibiotics: The patient does not require                          prophylactic antibiotics. Prior Anticoagulants: The                         patient has taken no anticoagulant or antiplatelet                         agents. ASA Grade Assessment: II - A patient with mild                         systemic disease. After reviewing the risks and                         benefits, the patient was deemed in satisfactory                         condition to undergo the procedure. The anesthesia                         plan was to use general anesthesia. Immediately prior                         to administration of medications, the patient was                         re-assessed for adequacy to receive sedatives. The                         heart rate, respiratory rate, oxygen saturations,                         blood pressure, adequacy of pulmonary ventilation, and                         response to care were monitored throughout the                         procedure. The physical status of the patient was                         re-assessed after the procedure.                        After obtaining informed consent, the colonoscope was                         passed under direct vision. Throughout the procedure,                         the patient's blood pressure, pulse, and oxygen                         saturations were monitored continuously. The  colonoscopy was unusually difficult due to multiple                         diverticula in the colon. Successful completion of the                         procedure was aided by withdrawing the scope and                         replacing with the pediatric colonoscope. The                         Colonoscope was introduced through the anus and                         advanced to the the terminal ileum, with                         identification of the appendiceal orifice and IC                         valve. The quality of the bowel preparation was                         evaluated using  the BBPS [facility] Bowel Preparation                         Scale) with scores of: Right Colon = 3, Transverse                         Colon = 3 and Left Colon = 3 (entire mucosa seen well                         with no residual staining, small fragments of stool or                         opaque liquid). The total BBPS score equals 9. The                         terminal ileum, ileocecal valve, appendiceal orifice,                         and rectum were photographed. Findings:      The perianal and digital rectal examinations were normal. Pertinent       negatives include normal sphincter tone and no palpable rectal lesions.      Multiple medium-mouthed diverticula were found in the recto-sigmoid       colon and sigmoid colon.      The retroflexed view of the distal rectum and anal verge was normal and       showed no anal or rectal abnormalities.      The exam was otherwise without abnormality. Impression:            - Diverticulosis in the recto-sigmoid colon and in the                         sigmoid colon.                        -  The distal rectum and anal verge are normal on                         retroflexion view.                        - The examination was otherwise normal.                        - No specimens collected. Recommendation:        - Discharge patient to home (with escort).                        - Resume previous diet today.                        - Continue present medications.                        - Repeat colonoscopy in 10 years for screening                         purposes. Procedure Code(s):     --- Professional ---                        G9562, Colorectal cancer screening; colonoscopy on                         individual not meeting criteria for high risk Diagnosis Code(s):     --- Professional ---                        Z12.11, Encounter for screening for malignant neoplasm                         of colon                        K57.30,  Diverticulosis of large intestine without                         perforation or abscess without bleeding CPT copyright 2022 American Medical Association. All rights reserved. The codes documented in this report are preliminary and upon coder review may  be revised to meet current compliance requirements. Dr. Libby Maw Toney Reil MD, MD 04/18/2023 10:05:05 AM This report has been signed electronically. Number of Addenda: 0 Note Initiated On: 04/18/2023 9:24 AM Scope Withdrawal Time: 0 hours 6 minutes 35 seconds  Total Procedure Duration: 0 hours 16 minutes 46 seconds  Estimated Blood Loss:  Estimated blood loss: none.      [facility]

## 2023-04-19 ENCOUNTER — Encounter: Payer: Self-pay | Admitting: Gastroenterology

## 2023-07-04 ENCOUNTER — Other Ambulatory Visit: Payer: Self-pay | Admitting: Family

## 2023-07-04 DIAGNOSIS — G8929 Other chronic pain: Secondary | ICD-10-CM

## 2023-07-05 ENCOUNTER — Other Ambulatory Visit: Payer: Self-pay | Admitting: Family

## 2023-07-05 DIAGNOSIS — J45909 Unspecified asthma, uncomplicated: Secondary | ICD-10-CM

## 2023-09-01 ENCOUNTER — Other Ambulatory Visit: Payer: Self-pay | Admitting: Family

## 2023-09-01 DIAGNOSIS — I10 Essential (primary) hypertension: Secondary | ICD-10-CM

## 2023-09-02 ENCOUNTER — Other Ambulatory Visit: Payer: Self-pay | Admitting: Family

## 2023-09-02 DIAGNOSIS — I10 Essential (primary) hypertension: Secondary | ICD-10-CM

## 2023-09-26 ENCOUNTER — Encounter: Payer: Self-pay | Admitting: Family

## 2023-09-26 ENCOUNTER — Ambulatory Visit (INDEPENDENT_AMBULATORY_CARE_PROVIDER_SITE_OTHER): Admitting: Family

## 2023-09-26 VITALS — BP 126/86 | HR 86 | Temp 98.3°F | Ht 71.0 in | Wt 221.2 lb

## 2023-09-26 DIAGNOSIS — R509 Fever, unspecified: Secondary | ICD-10-CM | POA: Diagnosis not present

## 2023-09-26 DIAGNOSIS — M25511 Pain in right shoulder: Secondary | ICD-10-CM | POA: Diagnosis not present

## 2023-09-26 DIAGNOSIS — I10 Essential (primary) hypertension: Secondary | ICD-10-CM

## 2023-09-26 DIAGNOSIS — M25512 Pain in left shoulder: Secondary | ICD-10-CM

## 2023-09-26 DIAGNOSIS — Z125 Encounter for screening for malignant neoplasm of prostate: Secondary | ICD-10-CM | POA: Diagnosis not present

## 2023-09-26 LAB — CBC WITH DIFFERENTIAL/PLATELET
Basophils Absolute: 0 K/uL (ref 0.0–0.1)
Basophils Relative: 0.6 % (ref 0.0–3.0)
Eosinophils Absolute: 0 K/uL (ref 0.0–0.7)
Eosinophils Relative: 0.7 % (ref 0.0–5.0)
HCT: 45 % (ref 39.0–52.0)
Hemoglobin: 15.2 g/dL (ref 13.0–17.0)
Lymphocytes Relative: 12.2 % (ref 12.0–46.0)
Lymphs Abs: 0.8 K/uL (ref 0.7–4.0)
MCHC: 33.7 g/dL (ref 30.0–36.0)
MCV: 86.9 fl (ref 78.0–100.0)
Monocytes Absolute: 0.6 K/uL (ref 0.1–1.0)
Monocytes Relative: 9.9 % (ref 3.0–12.0)
Neutro Abs: 4.9 K/uL (ref 1.4–7.7)
Neutrophils Relative %: 76.6 % (ref 43.0–77.0)
Platelets: 186 K/uL (ref 150.0–400.0)
RBC: 5.18 Mil/uL (ref 4.22–5.81)
RDW: 12.7 % (ref 11.5–15.5)
WBC: 6.4 K/uL (ref 4.0–10.5)

## 2023-09-26 LAB — URINALYSIS, ROUTINE W REFLEX MICROSCOPIC
Bilirubin Urine: NEGATIVE
Hgb urine dipstick: NEGATIVE
Ketones, ur: NEGATIVE
Leukocytes,Ua: NEGATIVE
Nitrite: NEGATIVE
Specific Gravity, Urine: 1.02 (ref 1.000–1.030)
Total Protein, Urine: NEGATIVE
Urine Glucose: NEGATIVE
Urobilinogen, UA: 0.2 (ref 0.0–1.0)
WBC, UA: NONE SEEN — AB (ref 0–?)
pH: 7 (ref 5.0–8.0)

## 2023-09-26 LAB — SEDIMENTATION RATE: Sed Rate: 2 mm/h (ref 0–15)

## 2023-09-26 LAB — TSH: TSH: 0.57 u[IU]/mL (ref 0.35–5.50)

## 2023-09-26 LAB — PSA: PSA: 1.24 ng/mL (ref 0.10–4.00)

## 2023-09-26 NOTE — Telephone Encounter (Signed)
 Lvm for pt, sent mychart msg

## 2023-09-26 NOTE — Progress Notes (Unsigned)
   Assessment & Plan:  There are no diagnoses linked to this encounter.   Return precautions given.   Risks, benefits, and alternatives of the medications and treatment plan prescribed today were discussed, and patient expressed understanding.   Education regarding symptom management and diagnosis given to patient on AVS either electronically or printed.  No follow-ups on file.  Rollene Northern, FNP  Subjective:    Patient ID: Josian Lanese, male    DOB: November 17, 1980, 43 y.o.   MRN: [id number]  CC: Riccardo Holeman is a 43 y.o. male who presents today for an acute visit.    HPI: HPI Temperature at home 100.8 this morning   Allergies: Amlodipine  Current Outpatient Medications on File Prior to Visit  Medication Sig Dispense Refill   hydrochlorothiazide  (MICROZIDE ) 12.5 MG capsule TAKE 1 CAPSULE BY MOUTH EVERY DAY 90 capsule 2   losartan  (COZAAR ) 50 MG tablet TAKE 2 TABLETS (100 MG TOTAL) BY MOUTH EVERY EVENING 180 tablet 1   meloxicam  (MOBIC ) 7.5 MG tablet TAKE 1 TABLET BY MOUTH DAILY AS NEEDED FOR PAIN 30 tablet 2   omeprazole (PRILOSEC) 20 MG capsule Take by mouth.     No current facility-administered medications on file prior to visit.    Review of Systems    Objective:    BP 126/86 (BP Location: Left Arm, Patient Position: Sitting, Cuff Size: Normal)   Pulse 86   Temp 98.3 F (36.8 C) (Oral)   Ht 5' 11 (1.803 m)   Wt 221 lb 3.2 oz (100.3 kg)   SpO2 95%   BMI 30.85 kg/m   BP Readings from Last 3 Encounters:  09/26/23 126/86  04/18/23 120/84  04/03/23 (!) 159/104   Wt Readings from Last 3 Encounters:  09/26/23 221 lb 3.2 oz (100.3 kg)  04/18/23 207 lb (93.9 kg)  04/03/23 219 lb (99.3 kg)    Physical Exam

## 2023-09-26 NOTE — Telephone Encounter (Signed)
 NOTED

## 2023-09-26 NOTE — Assessment & Plan Note (Signed)
 Worse righgt shoulder Declines xray at this time Consider PT with dry needling and massage

## 2023-09-27 DIAGNOSIS — R509 Fever, unspecified: Secondary | ICD-10-CM | POA: Insufficient documentation

## 2023-09-27 LAB — COMPREHENSIVE METABOLIC PANEL WITH GFR
ALT: 63 U/L — ABNORMAL HIGH (ref 0–53)
AST: 38 U/L — ABNORMAL HIGH (ref 0–37)
Albumin: 4.7 g/dL (ref 3.5–5.2)
Alkaline Phosphatase: 67 U/L (ref 39–117)
BUN: 14 mg/dL (ref 6–23)
CO2: 28 meq/L (ref 19–32)
Calcium: 9.6 mg/dL (ref 8.4–10.5)
Chloride: 99 meq/L (ref 96–112)
Creatinine, Ser: 1.07 mg/dL (ref 0.40–1.50)
GFR: 83.79 mL/min (ref >60.00)
Glucose, Bld: 86 mg/dL (ref 70–99)
Potassium: 4.4 meq/L (ref 3.5–5.1)
Sodium: 138 meq/L (ref 135–145)
Total Bilirubin: 0.9 mg/dL (ref 0.2–1.2)
Total Protein: 7.1 g/dL (ref 6.0–8.3)

## 2023-09-27 LAB — LIPID PANEL
Cholesterol: 211 mg/dL — ABNORMAL HIGH (ref 0–200)
HDL: 53.4 mg/dL (ref >39.00)
LDL Cholesterol: 134 mg/dL — ABNORMAL HIGH (ref 0–99)
NonHDL: 157.93
Total CHOL/HDL Ratio: 4
Triglycerides: 119 mg/dL (ref 0.0–149.0)
VLDL: 23.8 mg/dL (ref 0.0–40.0)

## 2023-09-27 LAB — C-REACTIVE PROTEIN: CRP: 2.1 mg/dL (ref 0.5–20.0)

## 2023-09-27 NOTE — Assessment & Plan Note (Signed)
 Fever, unspecified origin without associated symptoms or known exposure. Resolved. Patient well appearing. Pending CBC, kidney, and liver function tests,  urinalysis. He will let me know if the fever recurs  or new symptoms develop.

## 2023-09-27 NOTE — Assessment & Plan Note (Signed)
 Chronic, stable.Discussed goal < 130/80. Continue hydrochlorothiazide  12.5 mg daily, losartan  50 mg every day.  Discussed the potential trial of reducing losartan  dosage. Advised that he may reduce losartan  to 25 mg for 7 days and monitor blood pressure. If stable, consider discontinuing losartan  after 7-10 days on 12.5 mg.

## 2023-09-27 NOTE — Patient Instructions (Signed)
 Continue hydrochlorothiazide  12.5 mg daily, losartan  50 mg every day.  Discussed the potential trial of reducing losartan  dosage. Advised that he may reduce losartan  to 25 mg for 7 days and monitor blood pressure. If stable, consider discontinuing losartan  after 7-10 days on 12.5 mg.   Please renew meloxicam  for shoulder pain, icing therapy.  Please let me know if pain persists.

## 2023-09-29 ENCOUNTER — Ambulatory Visit: Payer: Self-pay | Admitting: Family

## 2023-09-29 DIAGNOSIS — R899 Unspecified abnormal finding in specimens from other organs, systems and tissues: Secondary | ICD-10-CM

## 2023-10-01 ENCOUNTER — Ambulatory Visit: Payer: Self-pay | Admitting: Family

## 2023-10-01 ENCOUNTER — Other Ambulatory Visit (INDEPENDENT_AMBULATORY_CARE_PROVIDER_SITE_OTHER)

## 2023-10-01 DIAGNOSIS — R899 Unspecified abnormal finding in specimens from other organs, systems and tissues: Secondary | ICD-10-CM

## 2023-10-01 LAB — HEPATIC FUNCTION PANEL
ALT: 54 U/L — ABNORMAL HIGH (ref 0–53)
AST: 27 U/L (ref 0–37)
Albumin: 4.5 g/dL (ref 3.5–5.2)
Alkaline Phosphatase: 63 U/L (ref 39–117)
Bilirubin, Direct: 0.1 mg/dL (ref 0.0–0.3)
Total Bilirubin: 0.7 mg/dL (ref 0.2–1.2)
Total Protein: 7.1 g/dL (ref 6.0–8.3)

## 2023-10-24 ENCOUNTER — Other Ambulatory Visit: Payer: Self-pay

## 2023-10-24 DIAGNOSIS — I10 Essential (primary) hypertension: Secondary | ICD-10-CM

## 2023-10-24 DIAGNOSIS — G8929 Other chronic pain: Secondary | ICD-10-CM

## 2023-10-24 NOTE — Telephone Encounter (Signed)
 Left message to return call to our office.  Need to know which pharmacy pt uses. We received a refill request from [facility], but looks like pt has refill at [facility].

## 2023-11-04 MED ORDER — LOSARTAN POTASSIUM 50 MG PO TABS: 100.0000 mg | ORAL_TABLET | Freq: Every evening | ORAL | 1 refills | Status: DC

## 2023-11-04 MED ORDER — MELOXICAM 7.5 MG PO TABS: 7.5000 mg | ORAL_TABLET | Freq: Every day | ORAL | 2 refills | Status: AC | PRN

## 2023-11-04 MED ORDER — HYDROCHLOROTHIAZIDE 12.5 MG PO CAPS: 12.5000 mg | ORAL_CAPSULE | Freq: Every day | ORAL | 2 refills | Status: AC

## 2023-11-12 ENCOUNTER — Ambulatory Visit: Payer: Self-pay

## 2023-11-12 NOTE — Telephone Encounter (Signed)
 Pt is scheduled on 11/14/23 @ 2:40 with Vincente, NP

## 2023-11-12 NOTE — Telephone Encounter (Signed)
 FYI Only or Action Required?: FYI only for provider.  Patient was last seen in primary care on 09/26/2023 by Dineen Rollene MATSU, FNP.  Called Nurse Triage reporting Hip Pain.  Symptoms began several months ago.  Interventions attempted: Rest, hydration, or home remedies.  Symptoms are: unchanged.  Triage Disposition: See PCP When Office is Open (Within 3 Days)  Patient/caregiver understands and will follow disposition?: Yes, will follow disposition  Copied from CRM #[id number]. Topic: Clinical - Red Word Triage >> Nov 12, 2023  8:25 AM Revonda D wrote: Red Word that prompted transfer to Nurse Triage: pain  Pt stated that he is experiencing pain in his right hip and its being an ongoing issue for several months. Pt would like to schedule an appt with provider. Reason for Disposition  [1] MODERATE pain (e.g., interferes with normal activities, limping) AND [2] present > 3 days  Answer Assessment - Initial Assessment Questions 1. LOCATION and RADIATION: Where is the pain located? Does the pain spread (shoot) anywhere else?     R hip 2. QUALITY: What does the pain feel like?  (e.g., sharp, dull, aching, burning)     Sharp pain with each step 3. SEVERITY: How bad is the pain? What does it keep you from doing?   (Scale 1-10; or mild, moderate, severe)     6 4. ONSET: When did the pain start? Does it come and go, or is it there all the time?     Several months 5. WORK OR EXERCISE: Has there been any recent work or exercise that involved this part of the body?      Wears a duty belt for work 6. CAUSE: What do you think is causing the hip pain?      unsure 7. AGGRAVATING FACTORS: What makes the hip pain worse? (e.g., walking, climbing stairs, running)     Walking, stairs, walking on incline is worse 8. OTHER SYMPTOMS: Do you have any other symptoms? (e.g., back pain, pain shooting down leg,  fever, rash)     Pt states that it does go down in to the thigh  Denies  injury, states that he has had this pain before, but this is worse. Sharp pain with each step. +CMS.  Protocols used: Hip Pain-A-AH

## 2023-11-14 ENCOUNTER — Ambulatory Visit: Admitting: Nurse Practitioner

## 2023-11-14 ENCOUNTER — Ambulatory Visit

## 2023-11-14 VITALS — BP 128/84 | HR 70 | Temp 98.1°F | Ht 71.0 in | Wt 217.4 lb

## 2023-11-14 DIAGNOSIS — M25551 Pain in right hip: Secondary | ICD-10-CM | POA: Insufficient documentation

## 2023-11-14 MED ORDER — PREDNISONE 10 MG PO TABS: ORAL_TABLET | ORAL | 0 refills | Status: DC
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
## 2023-11-14 NOTE — Assessment & Plan Note (Signed)
 Chronic pain, worsened by inclines, unresponsive to Tylenol and meloxicam .  - X ray of right hip pending.  - Prescribed prednisone  with six-day taper. - Continue meloxicam  and advised to use Tylenol Arthritis 650 mg every 6-8 hours as needed.

## 2023-11-20 ENCOUNTER — Encounter: Payer: Self-pay | Admitting: Nurse Practitioner

## 2023-11-21 ENCOUNTER — Ambulatory Visit: Payer: Self-pay | Admitting: Nurse Practitioner

## 2023-11-25 ENCOUNTER — Encounter: Payer: Self-pay | Admitting: Family

## 2023-11-25 ENCOUNTER — Telehealth: Admitting: Family

## 2023-11-25 ENCOUNTER — Ambulatory Visit: Admitting: Family

## 2023-11-25 VITALS — Ht 71.0 in | Wt 217.2 lb

## 2023-11-25 DIAGNOSIS — I10 Essential (primary) hypertension: Secondary | ICD-10-CM | POA: Diagnosis not present

## 2023-11-25 DIAGNOSIS — M25551 Pain in right hip: Secondary | ICD-10-CM

## 2023-11-25 MED ORDER — LOSARTAN POTASSIUM 50 MG PO TABS: 50.0000 mg | ORAL_TABLET | Freq: Every evening | ORAL | Status: AC

## 2023-11-25 NOTE — Assessment & Plan Note (Signed)
 Chronic, uncontrolled. Reviewed right hip xray. Presentation c/w trochanteric bursitis.  We agreed referral to physical therapy appropriate. Referral placed. If no improvement, will discuss orthopedic consult.

## 2023-11-25 NOTE — Patient Instructions (Signed)
 Referral to physical therapy  Let us  know if you dont hear back within 2 weeks in regards to an appointment being scheduled.   Low Back Sprain or Strain Rehab Ask your health care provider which exercises are safe for you. Do exercises exactly as told by your health care provider and adjust them as directed. It is normal to feel mild stretching, pulling, tightness, or discomfort as you do these exercises. Stop right away if you feel sudden pain or your pain gets worse. Do not begin these exercises until told by your health care provider. Stretching and range-of-motion exercises These exercises warm up your muscles and joints and improve the movement and flexibility of your back. These exercises also help to relieve pain, numbness, and tingling. Lumbar rotation  Lie on your back on a firm bed or the floor with your knees bent. Straighten your arms out to your sides so each arm forms a 90-degree angle (right angle) with a side of your body. Slowly move (rotate) both of your knees to one side of your body until you feel a stretch in your lower back (lumbar). Try not to let your shoulders lift off the floor. Hold this position for __________ seconds. Tense your abdominal muscles and slowly move your knees back to the starting position. Repeat this exercise on the other side of your body. Repeat __________ times. Complete this exercise __________ times a day. Single knee to chest  Lie on your back on a firm bed or the floor with both legs straight. Bend one of your knees. Use your hands to move your knee up toward your chest until you feel a gentle stretch in your lower back and buttock. Hold your leg in this position by holding on to the front of your knee. Keep your other leg as straight as possible. Hold this position for __________ seconds. Slowly return to the starting position. Repeat with your other leg. Repeat __________ times. Complete this exercise __________ times a day. Prone  extension on elbows  Lie on your abdomen on a firm bed or the floor (prone position). Prop yourself up on your elbows. Use your arms to help lift your chest up until you feel a gentle stretch in your abdomen and your lower back. This will place some of your body weight on your elbows. If this is uncomfortable, try stacking pillows under your chest. Your hips should stay down, against the surface that you are lying on. Keep your hip and back muscles relaxed. Hold this position for __________ seconds. Slowly relax your upper body and return to the starting position. Repeat __________ times. Complete this exercise __________ times a day. Strengthening exercises These exercises build strength and endurance in your back. Endurance is the ability to use your muscles for a long time, even after they get tired. Pelvic tilt This exercise strengthens the muscles that lie deep in the abdomen. Lie on your back on a firm bed or the floor with your legs extended. Bend your knees so they are pointing toward the ceiling and your feet are flat on the floor. Tighten your lower abdominal muscles to press your lower back against the floor. This motion will tilt your pelvis so your tailbone points up toward the ceiling instead of pointing to your feet or the floor. To help with this exercise, you may place a small towel under your lower back and try to push your back into the towel. Hold this position for __________ seconds. Let your muscles relax completely  before you repeat this exercise. Repeat __________ times. Complete this exercise __________ times a day. Alternating arm and leg raises  Get on your hands and knees on a firm surface. If you are on a hard floor, you may want to use padding, such as an exercise mat, to cushion your knees. Line up your arms and legs. Your hands should be directly below your shoulders, and your knees should be directly below your hips. Lift your left leg behind you. At the same  time, raise your right arm and straighten it in front of you. Do not lift your leg higher than your hip. Do not lift your arm higher than your shoulder. Keep your abdominal and back muscles tight. Keep your hips facing the ground. Do not arch your back. Keep your balance carefully, and do not hold your breath. Hold this position for __________ seconds. Slowly return to the starting position. Repeat with your right leg and your left arm. Repeat __________ times. Complete this exercise __________ times a day. Abdominal set with straight leg raise  Lie on your back on a firm bed or the floor. Bend one of your knees and keep your other leg straight. Tense your abdominal muscles and lift your straight leg up, 4-6 inches (10-15 cm) off the ground. Keep your abdominal muscles tight and hold this position for __________ seconds. Do not hold your breath. Do not arch your back. Keep it flat against the ground. Keep your abdominal muscles tense as you slowly lower your leg back to the starting position. Repeat with your other leg. Repeat __________ times. Complete this exercise __________ times a day. Single leg lower with bent knees Lie on your back on a firm bed or the floor. Tense your abdominal muscles and lift your feet off the floor, one foot at a time, so your knees and hips are bent in 90-degree angles (right angles). Your knees should be over your hips and your lower legs should be parallel to the floor. Keeping your abdominal muscles tense and your knee bent, slowly lower one of your legs so your toe touches the ground. Lift your leg back up to return to the starting position. Do not hold your breath. Do not let your back arch. Keep your back flat against the ground. Repeat with your other leg. Repeat __________ times. Complete this exercise __________ times a day. Posture and body mechanics Good posture and healthy body mechanics can help to relieve stress in your body's tissues and  joints. Body mechanics refers to the movements and positions of your body while you do your daily activities. Posture is part of body mechanics. Good posture means: Your spine is in its natural S-curve position (neutral). Your shoulders are pulled back slightly. Your head is not tipped forward (neutral). Follow these guidelines to improve your posture and body mechanics in your everyday activities. Standing  When standing, keep your spine neutral and your feet about hip-width apart. Keep a slight bend in your knees. Your ears, shoulders, and hips should line up. When you do a task in which you stand in one place for a long time, place one foot up on a stable object that is 2-4 inches (5-10 cm) high, such as a footstool. This helps keep your spine neutral. Sitting  When sitting, keep your spine neutral and keep your feet flat on the floor. Use a footrest, if necessary, and keep your thighs parallel to the floor. Avoid rounding your shoulders, and avoid tilting your head forward.  When working at a desk or a computer, keep your desk at a height where your hands are slightly lower than your elbows. Slide your chair under your desk so you are close enough to maintain good posture. When working at a computer, place your monitor at a height where you are looking straight ahead and you do not have to tilt your head forward or downward to look at the screen. Resting When lying down and resting, avoid positions that are most painful for you. If you have pain with activities such as sitting, bending, stooping, or squatting, lie in a position in which your body does not bend very much. For example, avoid curling up on your side with your arms and knees near your chest (fetal position). If you have pain with activities such as standing for a long time or reaching with your arms, lie with your spine in a neutral position and bend your knees slightly. Try the following positions: Lying on your side with a pillow  between your knees. Lying on your back with a pillow under your knees. Lifting  When lifting objects, keep your feet at least shoulder-width apart and tighten your abdominal muscles. Bend your knees and hips and keep your spine neutral. It is important to lift using the strength of your legs, not your back. Do not lock your knees straight out. Always ask for help to lift heavy or awkward objects. This information is not intended to replace advice given to you by your health care provider. Make sure you discuss any questions you have with your health care provider. Document Revised: 06/11/2022 Document Reviewed: 04/25/2020 Elsevier Patient Education  2024 [employer] Hip Bursitis Rehab Ask your health care provider which exercises are safe for you. Do exercises exactly as told by your health care provider and adjust them as directed. It is normal to feel mild stretching, pulling, tightness, or discomfort as you do these exercises. Stop right away if you feel sudden pain or your pain gets worse. Do not begin these exercises until told by your health care provider. Stretching exercise This exercise warms up your muscles and joints and improves the movement and flexibility of your hip. This exercise also helps to relieve pain and stiffness. Iliotibial band stretch An iliotibial band is a strong band of muscle tissue that runs from the outer side of your hip to the outer side of your thigh and knee. Lie on your side with your left / right leg in the top position. Bend your left / right knee and grab your ankle. Stretch out your bottom arm to help you balance. Slowly bring your knee back so your thigh is slightly behind your body. Slowly lower your knee toward the floor until you feel a gentle stretch on the outside of your left / right thigh. If you do not feel a stretch and your knee will not lower more toward the floor, place the heel of your other foot on top of your knee and pull your knee down  toward the floor with your foot. Hold this position for __________ seconds. Slowly return to the starting position. Repeat __________ times. Complete this exercise __________ times a day. Strengthening exercises These exercises build strength and endurance in your hip and pelvis. Endurance is the ability to use your muscles for a long time, even after they get tired. Bridge This exercise strengthens the muscles that move your thigh backward (hip extensors). Lie on your back on a firm surface with your knees  bent and your feet flat on the floor. Tighten your buttocks muscles and lift your buttocks off the floor until your trunk is level with your thighs. Do not arch your back. You should feel the muscles working in your buttocks and the back of your thighs. If you do not feel these muscles, slide your feet 1-2 inches (2.5-5 cm) farther away from your buttocks. If this exercise is too easy, try doing it with your arms crossed over your chest. Hold this position for __________ seconds. Slowly lower your hips to the starting position. Let your muscles relax completely after each repetition. Repeat __________ times. Complete this exercise __________ times a day. Squats This exercise strengthens the muscles in front of your thigh and knee (quadriceps). Stand in front of a table, with your feet and knees pointing straight ahead. You may rest your hands on the table for balance but not for support. Slowly bend your knees and lower your hips like you are going to sit in a chair. Keep your weight over your heels, not over your toes. Keep your lower legs upright so they are parallel with the table legs. Do not let your hips go lower than your knees. Do not bend lower than told by your health care provider. If your hip pain increases, do not bend as low. Hold the squat position for __________ seconds. Slowly push with your legs to return to standing. Do not use your hands to pull yourself to  standing. Repeat __________ times. Complete this exercise __________ times a day. Hip hike  Stand sideways on a bottom step. Stand on your left / right leg with your other foot unsupported next to the step. You can hold on to the railing or wall for balance if needed. Keep your knees straight and your torso square. Then lift your left / right hip up toward the ceiling. Hold this position for __________ seconds. Slowly let your left / right hip lower toward the floor, past the starting position. Your foot should get closer to the floor. Do not lean or bend your knees. Repeat __________ times. Complete this exercise __________ times a day. Single leg stand This exercise increases your balance. Without shoes, stand near a railing or in a doorway. You may hold on to the railing or door frame as needed for balance. Squeeze your left / right buttock muscles, then lift up your other foot. Do not let your left / right hip push out to the side. It is helpful to stand in front of a mirror for this exercise so you can watch your hip. Hold this position for __________ seconds. Repeat __________ times. Complete this exercise __________ times a day. This information is not intended to replace advice given to you by your health care provider. Make sure you discuss any questions you have with your health care provider. Document Revised: 01/18/2021 Document Reviewed: 01/18/2021 Elsevier Patient Education  2024 [employer].

## 2023-11-25 NOTE — Progress Notes (Unsigned)
 Virtual Visit via Video Note  I connected with Carlos Long on 11/25/23 at 10:30 AM EDT by a video enabled telemedicine application and verified that I am speaking with the correct person using two identifiers. Location patient: home Location provider: work  Persons participating in the virtual visit: patient, provider  I discussed the limitations of evaluation and management by telemedicine and the availability of in person appointments. The patient expressed understanding and agreed to proceed.  HPI: Hips pain has improved ' a whole lot better' .  Continues to have lateral right hip pain.  Hurts to sleep to right hip.  Denies numbness, groin pain.  Prednisone  with temporarily relief of right hip. Worse after long periods of sitting  Compliant with hydrochlorothiazide  12.5 mg daily,and reduced dose of  losartan  50 mg.    Xray lumbar 02/23/22 Disc space narrowing L4-L5.   Right pelvis xray 11/14/23  No evidence of femoral head osteonecrosis or focal soft tissue abnormality.  ROS: See pertinent positives and negatives per HPI.  EXAM:  VITALS per patient if applicable: Ht 5' 11 (1.803 m)   Wt 217 lb 3.2 oz (98.5 kg)   BMI 30.29 kg/m  BP Readings from Last 3 Encounters:  11/14/23 128/84  09/26/23 126/86  04/18/23 120/84   Wt Readings from Last 3 Encounters:  11/25/23 217 lb 3.2 oz (98.5 kg)  11/14/23 217 lb 6.4 oz (98.6 kg)  09/26/23 221 lb 3.2 oz (100.3 kg)    GENERAL: alert, oriented, appears well and in no acute distress  HEENT: atraumatic, conjunttiva clear, no obvious abnormalities on inspection of external nose and ears  NECK: normal movements of the head and neck  LUNGS: on inspection no signs of respiratory distress, breathing rate appears normal, no obvious gross SOB, gasping or wheezing  CV: no obvious cyanosis  MS: moves all visible extremities without noticeable abnormality  PSYCH/NEURO: pleasant and cooperative, no obvious depression or anxiety,  speech and thought processing grossly intact  ASSESSMENT AND PLAN: Right hip pain Assessment & Plan: Chronic, uncontrolled. Reviewed right hip xray. Presentation c/w trochanteric bursitis.  We agreed referral to physical therapy appropriate. Referral placed. If no improvement, will discuss orthopedic consult.   Orders: -     Ambulatory referral to Physical Therapy  Essential hypertension -     Losartan  Potassium; Take 1 tablet (50 mg total) by mouth every evening.  Primary hypertension Assessment & Plan: Chronic, stable. Continue  hydrochlorothiazide  12.5 mg daily,and reduced dose of  losartan  50 mg      -we discussed possible serious and likely etiologies, options for evaluation and workup, limitations of telemedicine visit vs in person visit, treatment, treatment risks and precautions. Pt prefers to treat via telemedicine empirically rather then risking or undertaking an in person visit at this moment.    I discussed the assessment and treatment plan with the patient. The patient was provided an opportunity to ask questions and all were answered. The patient agreed with the plan and demonstrated an understanding of the instructions.   The patient was advised to call back or seek an in-person evaluation if the symptoms worsen or if the condition fails to improve as anticipated.  Advised if desired AVS can be mailed or viewed via MyChart if Mychart user.   Rollene Northern, FNP

## 2023-11-25 NOTE — Assessment & Plan Note (Signed)
 Chronic, stable. Continue  hydrochlorothiazide  12.5 mg daily,and reduced dose of  losartan  50 mg
